# Patient Record
Sex: Female | Born: 1955 | Race: White | Hispanic: No | Smoking: Current every day smoker
Health system: Southern US, Community
[De-identification: ages and names within clinical notes are randomized; demographics above are authoritative.]

## PROBLEM LIST (undated history)

## (undated) DIAGNOSIS — F1910 Other psychoactive substance abuse, uncomplicated: Secondary | ICD-10-CM

## (undated) DIAGNOSIS — F329 Major depressive disorder, single episode, unspecified: Secondary | ICD-10-CM

## (undated) DIAGNOSIS — C801 Malignant (primary) neoplasm, unspecified: Secondary | ICD-10-CM

## (undated) DIAGNOSIS — D381 Neoplasm of uncertain behavior of trachea, bronchus and lung: Secondary | ICD-10-CM

## (undated) DIAGNOSIS — T1491XA Suicide attempt, initial encounter: Secondary | ICD-10-CM

## (undated) DIAGNOSIS — M1990 Unspecified osteoarthritis, unspecified site: Secondary | ICD-10-CM

## (undated) DIAGNOSIS — F32A Depression, unspecified: Secondary | ICD-10-CM

## (undated) HISTORY — DX: Neoplasm of uncertain behavior of trachea, bronchus and lung: D38.1

## (undated) HISTORY — PX: BREAST SURGERY: SHX581

## (undated) HISTORY — DX: Other psychoactive substance abuse, uncomplicated: F19.10

## (undated) HISTORY — PX: OTHER SURGICAL HISTORY: SHX169

## (undated) HISTORY — PX: FOREARM SURGERY: SHX651

## (undated) HISTORY — DX: Unspecified osteoarthritis, unspecified site: M19.90

---

## 2000-02-17 ENCOUNTER — Encounter: Admission: RE | Admit: 2000-02-17 | Discharge: 2000-05-17 | Payer: Self-pay | Admitting: Radiation Oncology

## 2002-04-18 ENCOUNTER — Ambulatory Visit (HOSPITAL_BASED_OUTPATIENT_CLINIC_OR_DEPARTMENT_OTHER): Admission: RE | Admit: 2002-04-18 | Discharge: 2002-04-18 | Payer: Self-pay | Admitting: *Deleted

## 2009-06-06 ENCOUNTER — Ambulatory Visit (HOSPITAL_COMMUNITY): Admission: RE | Admit: 2009-06-06 | Discharge: 2009-06-06 | Payer: Self-pay | Admitting: Family Medicine

## 2010-12-04 NOTE — Op Note (Signed)
Diana, Hernandez                             ACCOUNT NO.:  [id number]   MEDICAL RECORD NO.:  [id number]                   PATIENT TYPE:  AMB   LOCATION:  DSC                                  FACILITY:  [facility]   PHYSICIAN:  Lowell Bouton, M.D.      DATE OF BIRTH:  [birth year]   DATE OF PROCEDURE:  04/18/2002  DATE OF DISCHARGE:                                 OPERATIVE REPORT   PREOPERATIVE DIAGNOSIS:  Nonunion, left index metacarpal fracture.   POSTOPERATIVE DIAGNOSIS:  Nonunion, left index metacarpal fracture.   PROCEDURE:  Open reduction and internal fixation of left index metacarpal  fracture with bone grafting from left distal radius.   SURGEON:  Lowell Bouton, M.D.   ANESTHESIA:  General.   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  The bone was quite dense surrounding the  fracture site.   PROCEDURE:  Under general anesthesia, with the tourniquet on the left arm,  the left arm was prepped and draped in the usual fashion.  After  exsanguinating the limb, the tourniquet was inflated to 250 mmHg.   A longitudinal incision was made over the dorsum of the left index  metacarpal and blunt dissection was carried through the subcutaneous  tissues.  Bleeding points were coagulated.  Sharp dissection was carried  down through the periosteum after retracting the extensor tendon ulnarly.  Care was taken to protect the sensory nerves.  The periosteum was elevated  with a Therapist, nutritional at the fracture site.  A rongeur was used to take down  the fracture site.  The ends of the bone were then squared off with a saw,  carefully irrigating to avoid burning the bone.  After squaring off the ends  of the nonunion site, the metacarpal was pinned temporarily with an oblique  0.045 K-wire.  This was done with the index finger flexed to avoid a  rotational deformity.  After fixating with a K-wire, a  five-hole plate from  the minifragment segment was used and was contoured to the bone.  The distal  and proximal screws were inserted first using 2.7-mm screws.  The K-wire was  then removed and compression was applied with the remaining two screws  across the fracture site.  This allowed for good alignment of the fracture  and x-rays showed good position.   It was felt that bone grafting would be beneficial and so a transverse  incision was made separately over the dorsum of the distal radius.  Blunt  dissection was carried through the subcutaneous tissues and bleeding points  were coagulated.  Blunt dissection was carried down to the second dorsal  compartment and the wrist extensor tendons were retracted radially and  ulnarly.  The periosteum was incised sharply and a 0.045 K-wire was used to  drill holes in the dorsal cortex of the distal radius.  An oval-shaped  window was removed with an osteotomy and a curet was used to remove  cancellous bone.  The wound was then irrigated with saline and closed with 3-  0 subcuticular Prolene.  Steri-Strips were applied.  The bone graft was then  applied to the nonunion site and the periosteum was  closed with 4-0 Vicryl sutures.  The skin was closed with a 3-0 subcuticular  Prolene.  Steri-Strips were applied followed by sterile dressings.  The  patient was placed in a volar protective splint.  The tourniquet was  released with good circulation to the hand.  She went to the recovery room  awake, stable and in good condition.                                               Lowell Bouton, M.D.    EMM/MEDQ  D:  04/18/2002  T:  04/19/2002  Job:  [id number]

## 2013-02-28 DIAGNOSIS — R0989 Other specified symptoms and signs involving the circulatory and respiratory systems: Secondary | ICD-10-CM

## 2013-02-28 DIAGNOSIS — R4182 Altered mental status, unspecified: Secondary | ICD-10-CM

## 2016-05-07 ENCOUNTER — Encounter (HOSPITAL_COMMUNITY): Payer: Self-pay

## 2016-05-07 ENCOUNTER — Emergency Department (HOSPITAL_COMMUNITY): Payer: Managed Care, Other (non HMO)

## 2016-05-07 ENCOUNTER — Emergency Department (HOSPITAL_COMMUNITY)
Admission: EM | Admit: 2016-05-07 | Discharge: 2016-05-07 | Disposition: A | Discharge status: Home / Self Care | Payer: Managed Care, Other (non HMO) | Attending: Emergency Medicine | Admitting: Emergency Medicine

## 2016-05-07 DIAGNOSIS — F17200 Nicotine dependence, unspecified, uncomplicated: Secondary | ICD-10-CM | POA: Insufficient documentation

## 2016-05-07 DIAGNOSIS — R0781 Pleurodynia: Secondary | ICD-10-CM

## 2016-05-07 DIAGNOSIS — J189 Pneumonia, unspecified organism: Secondary | ICD-10-CM | POA: Diagnosis not present

## 2016-05-07 DIAGNOSIS — E876 Hypokalemia: Secondary | ICD-10-CM | POA: Insufficient documentation

## 2016-05-07 DIAGNOSIS — J181 Lobar pneumonia, unspecified organism: Secondary | ICD-10-CM

## 2016-05-07 LAB — CBC WITH DIFFERENTIAL/PLATELET
Basophils Absolute: 0 10*3/uL (ref 0.0–0.1)
Basophils Relative: 0 %
EOS ABS: 0.1 10*3/uL (ref 0.0–0.7)
EOS PCT: 1 %
HCT: 36.8 % (ref 36.0–46.0)
Hemoglobin: 12.3 g/dL (ref 12.0–15.0)
LYMPHS ABS: 3 10*3/uL (ref 0.7–4.0)
LYMPHS PCT: 16 %
MCH: 28.9 pg (ref 26.0–34.0)
MCHC: 33.4 g/dL (ref 30.0–36.0)
MCV: 86.6 fL (ref 78.0–100.0)
MONO ABS: 1.3 10*3/uL — AB (ref 0.1–1.0)
Monocytes Relative: 7 %
Neutro Abs: 13.9 10*3/uL — ABNORMAL HIGH (ref 1.7–7.7)
Neutrophils Relative %: 76 %
PLATELETS: 320 10*3/uL (ref 150–400)
RBC: 4.25 MIL/uL (ref 3.87–5.11)
RDW: 13.2 % (ref 11.5–15.5)
WBC: 18.2 10*3/uL — AB (ref 4.0–10.5)

## 2016-05-07 LAB — BASIC METABOLIC PANEL
Anion gap: 10 (ref 5–15)
BUN: 9 mg/dL (ref 6–20)
CO2: 25 mmol/L (ref 22–32)
Calcium: 8.7 mg/dL — ABNORMAL LOW (ref 8.9–10.3)
Chloride: 102 mmol/L (ref 101–111)
Creatinine, Ser: 0.61 mg/dL (ref 0.44–1.00)
GFR calc Af Amer: >60 mL/min (ref >60)
GFR calc non Af Amer: >60 mL/min (ref >60)
Glucose, Bld: 118 mg/dL — ABNORMAL HIGH (ref 65–99)
Potassium: 3.1 mmol/L — ABNORMAL LOW (ref 3.5–5.1)
Sodium: 137 mmol/L (ref 135–145)

## 2016-05-07 LAB — LACTIC ACID, PLASMA: LACTIC ACID, VENOUS: 1 mmol/L (ref 0.5–1.9)

## 2016-05-07 MED ORDER — ALBUTEROL SULFATE (2.5 MG/3ML) 0.083% IN NEBU: 5.0000 mg | INHALATION_SOLUTION | Freq: Once | RESPIRATORY_TRACT | Status: DC

## 2016-05-07 MED ORDER — IPRATROPIUM-ALBUTEROL 0.5-2.5 (3) MG/3ML IN SOLN
3.0000 mL | RESPIRATORY_TRACT | Status: AC
  Administered 2016-05-07: 3 mL via RESPIRATORY_TRACT
  Filled 2016-05-07: qty 3

## 2016-05-07 MED ORDER — ALBUTEROL SULFATE (2.5 MG/3ML) 0.083% IN NEBU
2.5000 mg | INHALATION_SOLUTION | RESPIRATORY_TRACT | Status: AC
  Administered 2016-05-07: 2.5 mg via RESPIRATORY_TRACT
  Filled 2016-05-07: qty 3

## 2016-05-07 MED ORDER — POTASSIUM CHLORIDE CRYS ER 20 MEQ PO TBCR
40.0000 meq | EXTENDED_RELEASE_TABLET | Freq: Once | ORAL | Status: AC
  Administered 2016-05-07: 40 meq via ORAL
  Filled 2016-05-07: qty 2

## 2016-05-07 MED ORDER — POTASSIUM CHLORIDE ER 10 MEQ PO TBCR: 10.0000 meq | EXTENDED_RELEASE_TABLET | Freq: Two times a day (BID) | ORAL | 0 refills | Status: DC

## 2016-05-07 MED ORDER — SODIUM CHLORIDE 0.9 % IV BOLUS (SEPSIS)
1000.0000 mL | Freq: Once | INTRAVENOUS | Status: AC
  Administered 2016-05-07: 1000 mL via INTRAVENOUS

## 2016-05-07 MED ORDER — AZITHROMYCIN 250 MG PO TABS: ORAL_TABLET | ORAL | 0 refills | Status: DC

## 2016-05-07 MED ORDER — NAPROXEN 250 MG PO TABS: ORAL_TABLET | ORAL | 0 refills | Status: DC

## 2016-05-07 MED ORDER — DEXTROSE 5 % IV SOLN
500.0000 mg | Freq: Once | INTRAVENOUS | Status: AC
  Administered 2016-05-07: 500 mg via INTRAVENOUS
  Filled 2016-05-07: qty 500

## 2016-05-07 MED ORDER — KETOROLAC TROMETHAMINE 30 MG/ML IJ SOLN
30.0000 mg | Freq: Once | INTRAMUSCULAR | Status: AC
  Administered 2016-05-07: 30 mg via INTRAVENOUS
  Filled 2016-05-07: qty 1

## 2016-05-07 MED ORDER — CEFTRIAXONE SODIUM 1 G IJ SOLR
1.0000 g | Freq: Once | INTRAMUSCULAR | Status: AC
  Administered 2016-05-07: 1 g via INTRAVENOUS
  Filled 2016-05-07: qty 10

## 2016-05-07 MED ORDER — IPRATROPIUM BROMIDE 0.02 % IN SOLN: 0.5000 mg | Freq: Once | RESPIRATORY_TRACT | Status: DC

## 2016-05-07 NOTE — Discharge Instructions (Signed)
Use ice and heat over the painful areas. Take mucinex DM for cough. Take the antibiotic (azithromycin) until gone, start the pills tomorrow, you got the first dose today in the ED. Take the naproxen for pain and it should also help with your fever. If you get a fever you can take acetaminophen 650 mg 4 times a day. Have your doctor recheck you in 3 weeks if you are improving to make sure the pneumonia is clearing up on your chest xray. However, if you get worse, such as struggling to breathe, uncontrolled vomiting, worsening pain or uncontrolled fevers, you should return to the ED.

## 2016-05-07 NOTE — ED Provider Notes (Signed)
[facility] DEPT Provider Note   CSN: [id number] Arrival date & time: 05/07/16  0301  Time seen 03:33 AM   History   Chief Complaint Chief Complaint  Patient presents with  . Chest Pain    right ribcage    HPI Diana Hernandez is a 59 y.o. female.  HPI patient reports she started getting URI symptoms last week with cough and had to miss several days to work however she did get back to work on the 16th. She states she was feeling better however her cough started getting worse today. She also states her cough is worse at night. She states about 2 AM she woke up with some right sided chest pain that was sharp and would come and go. She states initially the pain was about 15 minutes. Moving around and taking big deep breath makes the pain worse. She points to her right anterior lower rib cage as to where the pain is located. She denies fever although she states tonight when she came home from work she was having chills and she was diaphoretic. She denies any wheezing or sputum production. She states she does feel short of breath tonight. She also states tonight she was having dry heaves with nausea but no vomiting. She denies sore throat or diarrhea.   PCP Dr Scotty Court  History reviewed. No pertinent past medical history.  There are no active problems to display for this patient.   History reviewed. No pertinent surgical history.  OB History    No data available       Home Medications    Prior to Admission medications   Medication Sig Start Date End Date Taking? Authorizing Provider  azithromycin (ZITHROMAX) 250 MG tablet Take 1 po Daily starting on Oct 21 05/07/16   Rolland Porter, MD  naproxen (NAPROSYN) 250 MG tablet Take 1 po BID with food prn pain 05/07/16   Rolland Porter, MD  potassium chloride (K-DUR) 10 MEQ tablet Take 1 tablet (10 mEq total) by mouth 2 (two) times daily. 05/07/16   Rolland Porter, MD    Family History No family history on file.  Social History Social History    Substance Use Topics  . Smoking status: Current Every Day Smoker  . Smokeless tobacco: Never Used  . Alcohol use Yes  Lives at home Smokes one half pack per day Lives with spouse employed   Allergies   Review of patient's allergies indicates no known allergies.   Review of Systems Review of Systems  All other systems reviewed and are negative.    Physical Exam Updated Vital Signs BP 149/85   Pulse 110   Temp 98.3 F (36.8 C)   Resp 20   Ht 5\' 3"  (1.6 m)   Wt 115 lb (52.2 kg)   SpO2 95%   BMI 20.37 kg/m   Vital signs normal except tachycardia   Physical Exam  Constitutional: She is oriented to person, place, and time. She appears well-developed and well-nourished.  Non-toxic appearance. She does not appear ill. No distress.  HENT:  Head: Normocephalic and atraumatic.  Right Ear: External ear normal.  Left Ear: External ear normal.  Nose: Nose normal. No mucosal edema or rhinorrhea.  Mouth/Throat: Mucous membranes are normal. No dental abscesses or uvula swelling.  Tongue is dry  Eyes: Conjunctivae and EOM are normal. Pupils are equal, round, and reactive to light.  Neck: Normal range of motion and full passive range of motion without pain. Neck supple.  Cardiovascular: Normal rate,  regular rhythm and normal heart sounds.  Exam reveals no gallop and no friction rub.   No murmur heard. Pulmonary/Chest: Effort normal and breath sounds normal. No respiratory distress. She has no wheezes. She has no rhonchi. She has no rales. She exhibits no crepitus.    Although her lungs are clear she has a very tight sounding cough. She is noted to have episodes of coughing paroxysms. She has diffuse tenderness over right anterior lower chest without crepitance.  Abdominal: Soft. Normal appearance and bowel sounds are normal. She exhibits no distension. There is no tenderness. There is no rebound and no guarding.  Musculoskeletal: Normal range of motion. She exhibits no edema or  tenderness.  Moves all extremities well.   Neurological: She is alert and oriented to person, place, and time. She has normal strength. No cranial nerve deficit.  Skin: Skin is warm, dry and intact. No rash noted. No erythema. No pallor.  Psychiatric: She has a normal mood and affect. Her speech is normal and behavior is normal. Her mood appears not anxious.  Nursing note and vitals reviewed.    ED Treatments / Results  Labs (all labs ordered are listed, but only abnormal results are displayed) Results for orders placed or performed during the hospital encounter of 05/07/16  Culture, blood (routine x 2)  Result Value Ref Range   Specimen Description BLOOD RIGHT ANTECUBITAL    Special Requests      BOTTLES DRAWN AEROBIC AND ANAEROBIC AEB 10CC ANA 4CC   Culture PENDING    Report Status PENDING   Culture, blood (routine x 2)  Result Value Ref Range   Specimen Description BLOOD LEFT HAND    Special Requests BOTTLES DRAWN AEROBIC AND ANAEROBIC 5CC EACH    Culture PENDING    Report Status PENDING   Basic metabolic panel  Result Value Ref Range   Sodium 137 135 - 145 mmol/L   Potassium 3.1 (L) 3.5 - 5.1 mmol/L   Chloride 102 101 - 111 mmol/L   CO2 25 22 - 32 mmol/L   Glucose, Bld 118 (H) 65 - 99 mg/dL   BUN 9 6 - 20 mg/dL   Creatinine, Ser 0.61 0.44 - 1.00 mg/dL   Calcium 8.7 (L) 8.9 - 10.3 mg/dL   GFR calc non Af Amer >60 >60 mL/min   GFR calc Af Amer >60 >60 mL/min   Anion gap 10 5 - 15  CBC with Differential  Result Value Ref Range   WBC 18.2 (H) 4.0 - 10.5 K/uL   RBC 4.25 3.87 - 5.11 MIL/uL   Hemoglobin 12.3 12.0 - 15.0 g/dL   HCT 36.8 36.0 - 46.0 %   MCV 86.6 78.0 - 100.0 fL   MCH 28.9 26.0 - 34.0 pg   MCHC 33.4 30.0 - 36.0 g/dL   RDW 13.2 11.5 - 15.5 %   Platelets 320 150 - 400 K/uL   Neutrophils Relative % 76 %   Neutro Abs 13.9 (H) 1.7 - 7.7 K/uL   Lymphocytes Relative 16 %   Lymphs Abs 3.0 0.7 - 4.0 K/uL   Monocytes Relative 7 %   Monocytes Absolute 1.3 (H)  0.1 - 1.0 K/uL   Eosinophils Relative 1 %   Eosinophils Absolute 0.1 0.0 - 0.7 K/uL   Basophils Relative 0 %   Basophils Absolute 0.0 0.0 - 0.1 K/uL  Lactic acid, plasma  Result Value Ref Range   Lactic Acid, Venous 1.0 0.5 - 1.9 mmol/L   Laboratory interpretation all  normal except leukocytosis, hypokalemia    EKG  EKG Interpretation None       Radiology Dg Ribs Unilateral W/chest Right  Result Date: 05/07/2016 CLINICAL DATA:  60 year old female with cough and congestion and right-sided rib pain. EXAM: RIGHT RIBS AND CHEST - 3+ VIEW COMPARISON:  None. FINDINGS: There is area of increased density at the right lung base which may represent atelectasis/ scarring versus infiltrate. Left lung base interstitial crowding and atelectatic changes. There is mild diffuse interstitial coarsening. There is no pleural effusion or pneumothorax. The cardiac silhouette is within normal limits. There is osteopenia.  No acute fracture. IMPRESSION: No acute rib fracture or pneumothorax. Right lung base atelectasis/scarring versus infiltrate. Clinical correlation and follow-up recommended. Electronically Signed   By: Anner Crete M.D.   On: 05/07/2016 05:31    Procedures Procedures (including critical care time)  Medications Ordered in ED Medications  sodium chloride 0.9 % bolus 1,000 mL (0 mLs Intravenous Stopped 05/07/16 0557)  ipratropium-albuterol (DUONEB) 0.5-2.5 (3) MG/3ML nebulizer solution 3 mL (3 mLs Nebulization Given 05/07/16 0359)  albuterol (PROVENTIL) (2.5 MG/3ML) 0.083% nebulizer solution 2.5 mg (2.5 mg Nebulization Given 05/07/16 0359)  potassium chloride SA (K-DUR,KLOR-CON) CR tablet 40 mEq (40 mEq Oral Given 05/07/16 0456)  cefTRIAXone (ROCEPHIN) 1 g in dextrose 5 % 50 mL IVPB (0 g Intravenous Stopped 05/07/16 0529)  azithromycin (ZITHROMAX) 500 mg in dextrose 5 % 250 mL IVPB (0 mg Intravenous Stopped 05/07/16 0557)  ketorolac (TORADOL) 30 MG/ML injection 30 mg (30 mg  Intravenous Given 05/07/16 0557)     Initial Impression / Assessment and Plan / ED Course  I have reviewed the triage vital signs and the nursing notes.  Pertinent labs & imaging results that were available during my care of the patient were reviewed by me and considered in my medical decision making (see chart for details).  Clinical Course   Because of her tight sounding cough she was given a albuterol nebulizer to see if that will help with her feeling of shortness of breath or coughing. She was given a liter of normal saline because she did appear to be mildly dehydrated.  Recheck at 4:35 AM patient reports nebulizer did not change how she felt. I had already looked at her chest x-ray and she has a right lower lobe infiltrate. She was started on 2 ED acquired pneumonia antibiotics. Blood cultures and lactic acid was done. I'm going to have nursing staff emulate patient with pulse ox and make sure she is not hypoxic. We are still waiting for radiology for the official reading of her rib x-rays.  Pt was given oral potassium for his hypokalemia  5 AM patient was ambulated by nursing staff and her pulse ox remained 93% or higher. Still waiting for her x-ray to be read by radiology.  Recheck at 06:00 AM radiology has read her CXR. Pt states she is feeling better and feels she can be discharged home. Does not have to work until the 23rd. Discussed her discharge instructions.   Final Clinical Impressions(s) / ED Diagnoses   Final diagnoses:  Hypokalemia  Pleuritic chest pain  Community acquired pneumonia of right lower lobe of lung (HCC)    New Prescriptions New Prescriptions   AZITHROMYCIN (ZITHROMAX) 250 MG TABLET    Take 1 po Daily starting on Oct 21   NAPROXEN (NAPROSYN) 250 MG TABLET    Take 1 po BID with food prn pain   POTASSIUM CHLORIDE (K-DUR) 10 MEQ TABLET  Take 1 tablet (10 mEq total) by mouth 2 (two) times daily.    Plan discharge  Rolland Porter, MD, Barbette Or, MD 05/07/16 [phone]

## 2016-05-07 NOTE — ED Notes (Signed)
Pt walked without assistance and oxygen saturation did not drop lower than 93% on room air.

## 2016-05-07 NOTE — ED Triage Notes (Signed)
Pt states she has had cold symptoms recently and since last night has been coughing. Pt states she awoke with pain to her right ribcage that is worse with cough and palpation.

## 2016-05-12 LAB — CULTURE, BLOOD (ROUTINE X 2)
CULTURE: NO GROWTH
Culture: NO GROWTH

## 2017-12-28 ENCOUNTER — Other Ambulatory Visit: Payer: Self-pay

## 2017-12-28 ENCOUNTER — Inpatient Hospital Stay (HOSPITAL_COMMUNITY)
Admission: AD | Admit: 2017-12-28 | Discharge: 2018-01-02 | DRG: 885 | Disposition: A | Discharge status: Home / Self Care | Payer: 59 | Source: Intra-hospital | Attending: Psychiatry | Admitting: Psychiatry

## 2017-12-28 ENCOUNTER — Encounter (HOSPITAL_COMMUNITY): Payer: Self-pay | Admitting: *Deleted

## 2017-12-28 ENCOUNTER — Encounter (HOSPITAL_COMMUNITY): Payer: Self-pay

## 2017-12-28 ENCOUNTER — Emergency Department (HOSPITAL_COMMUNITY)
Admission: EM | Admit: 2017-12-28 | Discharge: 2017-12-28 | Disposition: A | Discharge status: Other Acute Inpatient Hospital | Payer: Managed Care, Other (non HMO) | Attending: Emergency Medicine | Admitting: Emergency Medicine

## 2017-12-28 DIAGNOSIS — T1491XA Suicide attempt, initial encounter: Secondary | ICD-10-CM | POA: Insufficient documentation

## 2017-12-28 DIAGNOSIS — F419 Anxiety disorder, unspecified: Secondary | ICD-10-CM | POA: Diagnosis present

## 2017-12-28 DIAGNOSIS — Y929 Unspecified place or not applicable: Secondary | ICD-10-CM | POA: Insufficient documentation

## 2017-12-28 DIAGNOSIS — G4700 Insomnia, unspecified: Secondary | ICD-10-CM | POA: Diagnosis present

## 2017-12-28 DIAGNOSIS — R45851 Suicidal ideations: Secondary | ICD-10-CM | POA: Insufficient documentation

## 2017-12-28 DIAGNOSIS — N390 Urinary tract infection, site not specified: Secondary | ICD-10-CM | POA: Diagnosis present

## 2017-12-28 DIAGNOSIS — F17200 Nicotine dependence, unspecified, uncomplicated: Secondary | ICD-10-CM | POA: Insufficient documentation

## 2017-12-28 DIAGNOSIS — Z859 Personal history of malignant neoplasm, unspecified: Secondary | ICD-10-CM | POA: Diagnosis not present

## 2017-12-28 DIAGNOSIS — T50992A Poisoning by other drugs, medicaments and biological substances, intentional self-harm, initial encounter: Secondary | ICD-10-CM | POA: Insufficient documentation

## 2017-12-28 DIAGNOSIS — Z046 Encounter for general psychiatric examination, requested by authority: Secondary | ICD-10-CM | POA: Insufficient documentation

## 2017-12-28 DIAGNOSIS — F329 Major depressive disorder, single episode, unspecified: Secondary | ICD-10-CM | POA: Diagnosis present

## 2017-12-28 DIAGNOSIS — K5900 Constipation, unspecified: Secondary | ICD-10-CM | POA: Diagnosis present

## 2017-12-28 DIAGNOSIS — F332 Major depressive disorder, recurrent severe without psychotic features: Secondary | ICD-10-CM | POA: Diagnosis present

## 2017-12-28 DIAGNOSIS — Y9389 Activity, other specified: Secondary | ICD-10-CM | POA: Insufficient documentation

## 2017-12-28 DIAGNOSIS — Z79899 Other long term (current) drug therapy: Secondary | ICD-10-CM | POA: Diagnosis not present

## 2017-12-28 DIAGNOSIS — Z23 Encounter for immunization: Secondary | ICD-10-CM

## 2017-12-28 DIAGNOSIS — F17210 Nicotine dependence, cigarettes, uncomplicated: Secondary | ICD-10-CM | POA: Diagnosis present

## 2017-12-28 DIAGNOSIS — X838XXA Intentional self-harm by other specified means, initial encounter: Secondary | ICD-10-CM | POA: Insufficient documentation

## 2017-12-28 DIAGNOSIS — Y999 Unspecified external cause status: Secondary | ICD-10-CM | POA: Diagnosis not present

## 2017-12-28 DIAGNOSIS — R300 Dysuria: Secondary | ICD-10-CM

## 2017-12-28 DIAGNOSIS — Z915 Personal history of self-harm: Secondary | ICD-10-CM

## 2017-12-28 DIAGNOSIS — T450X2A Poisoning by antiallergic and antiemetic drugs, intentional self-harm, initial encounter: Secondary | ICD-10-CM | POA: Diagnosis not present

## 2017-12-28 DIAGNOSIS — Z7289 Other problems related to lifestyle: Secondary | ICD-10-CM | POA: Diagnosis not present

## 2017-12-28 HISTORY — DX: Depression, unspecified: F32.A

## 2017-12-28 HISTORY — DX: Malignant (primary) neoplasm, unspecified: C80.1

## 2017-12-28 HISTORY — DX: Suicide attempt, initial encounter: T14.91XA

## 2017-12-28 HISTORY — DX: Major depressive disorder, single episode, unspecified: F32.9

## 2017-12-28 LAB — COMPREHENSIVE METABOLIC PANEL
ALBUMIN: 3.9 g/dL (ref 3.5–5.0)
ALT: 16 U/L (ref 14–54)
ANION GAP: 9 (ref 5–15)
AST: 16 U/L (ref 15–41)
Alkaline Phosphatase: 96 U/L (ref 38–126)
BUN: 16 mg/dL (ref 6–20)
CHLORIDE: 102 mmol/L (ref 101–111)
CO2: 29 mmol/L (ref 22–32)
CREATININE: 0.68 mg/dL (ref 0.44–1.00)
Calcium: 8.9 mg/dL (ref 8.9–10.3)
GFR calc non Af Amer: >60 mL/min (ref >60)
Glucose, Bld: 104 mg/dL — ABNORMAL HIGH (ref 65–99)
Potassium: 3.5 mmol/L (ref 3.5–5.1)
SODIUM: 140 mmol/L (ref 135–145)
Total Bilirubin: 0.7 mg/dL (ref 0.3–1.2)
Total Protein: 6.9 g/dL (ref 6.5–8.1)

## 2017-12-28 LAB — CBC WITH DIFFERENTIAL/PLATELET
BASOS PCT: 1 %
Basophils Absolute: 0.1 10*3/uL (ref 0.0–0.1)
EOS ABS: 0.2 10*3/uL (ref 0.0–0.7)
EOS PCT: 2 %
HCT: 41.4 % (ref 36.0–46.0)
Hemoglobin: 13.1 g/dL (ref 12.0–15.0)
Lymphocytes Relative: 34 %
Lymphs Abs: 3.1 10*3/uL (ref 0.7–4.0)
MCH: 28.4 pg (ref 26.0–34.0)
MCHC: 31.6 g/dL (ref 30.0–36.0)
MCV: 89.6 fL (ref 78.0–100.0)
MONOS PCT: 8 %
Monocytes Absolute: 0.8 10*3/uL (ref 0.1–1.0)
Neutro Abs: 5.2 10*3/uL (ref 1.7–7.7)
Neutrophils Relative %: 55 %
Platelets: 360 10*3/uL (ref 150–400)
RBC: 4.62 MIL/uL (ref 3.87–5.11)
RDW: 13.3 % (ref 11.5–15.5)
WBC: 9.3 10*3/uL (ref 4.0–10.5)

## 2017-12-28 LAB — ETHANOL: Alcohol, Ethyl (B): <10 mg/dL (ref <10)

## 2017-12-28 LAB — URINALYSIS, ROUTINE W REFLEX MICROSCOPIC
BILIRUBIN URINE: NEGATIVE
Bacteria, UA: NONE SEEN
Glucose, UA: NEGATIVE mg/dL
Hgb urine dipstick: NEGATIVE
Ketones, ur: NEGATIVE mg/dL
Nitrite: NEGATIVE
Protein, ur: NEGATIVE mg/dL
SPECIFIC GRAVITY, URINE: 1.012 (ref 1.005–1.030)
pH: 7 (ref 5.0–8.0)

## 2017-12-28 LAB — RAPID URINE DRUG SCREEN, HOSP PERFORMED
AMPHETAMINES: NOT DETECTED
Barbiturates: NOT DETECTED
Benzodiazepines: NOT DETECTED
COCAINE: NOT DETECTED
OPIATES: NOT DETECTED
TETRAHYDROCANNABINOL: NOT DETECTED

## 2017-12-28 LAB — SALICYLATE LEVEL

## 2017-12-28 LAB — ACETAMINOPHEN LEVEL

## 2017-12-28 MED ORDER — PNEUMOCOCCAL VAC POLYVALENT 25 MCG/0.5ML IJ INJ
0.5000 mL | INJECTION | INTRAMUSCULAR | Status: AC
  Administered 2017-12-29: 0.5 mL via INTRAMUSCULAR

## 2017-12-28 MED ORDER — NITROFURANTOIN MONOHYD MACRO 100 MG PO CAPS
100.0000 mg | ORAL_CAPSULE | Freq: Two times a day (BID) | ORAL | Status: AC
  Administered 2017-12-28 – 2018-01-01 (×9): 100 mg via ORAL
  Filled 2017-12-28 (×11): qty 1

## 2017-12-28 MED ORDER — NITROFURANTOIN MONOHYD MACRO 100 MG PO CAPS
100.0000 mg | ORAL_CAPSULE | Freq: Two times a day (BID) | ORAL | Status: DC
  Administered 2017-12-28: 100 mg via ORAL
  Filled 2017-12-28: qty 1

## 2017-12-28 MED ORDER — NICOTINE POLACRILEX 2 MG MT GUM
2.0000 mg | CHEWING_GUM | OROMUCOSAL | Status: DC | PRN
  Administered 2017-12-30 – 2018-01-02 (×6): 2 mg via ORAL
  Filled 2017-12-28 (×2): qty 1

## 2017-12-28 NOTE — ED Notes (Signed)
TTS in progress 

## 2017-12-28 NOTE — ED Notes (Signed)
Patient wanded and changed into purple scrubs. Patient wanded a second time after changing. Pt and pt's mother informed of visiting hours and suicide precaution policies. Patient wrote down list of her important contacts. Patients belongings (clothes, pocketbook, cell phone, shoes) all given to patient's mother who states she is taking everything back home. Patient calm and cooperative at this time.

## 2017-12-28 NOTE — ED Notes (Signed)
Poison control notified of pt's overdose.  Was instructed to obtain tylenol level, aspirin level, and LFT's.  Results given to poison control and pt cleared by poison control.  Notified Dr. Melina Copa.

## 2017-12-28 NOTE — Progress Notes (Signed)
Diana Hernandez is a 62 year old female pt admitted on voluntary basis. She reports that she took the overdose of medications and got in her car and drove to an area with the intention of going to sleep and not waking up. She reports that the daughter of her friend found her and she wound up coming into the hospital. She does not report any stressors other than she has not been resting well and being stressed. She denies any substance abuse issues and UDS and ETOH were negative. She reports that she takes her depression medication as prescribed and reports that she has been taking it for years. She reports that she lives with her ex-husband, Liliane Channel, and reports the relationship is doing good and she will go back there after discharge. Khalise was oriented to the unit and safety maintained.

## 2017-12-28 NOTE — ED Provider Notes (Signed)
[facility] EMERGENCY DEPARTMENT Provider Note   CSN: [id number] Arrival date & time: 12/28/17  1012     History   Chief Complaint Chief Complaint  Patient presents with  . V70.1    HPI Diana Hernandez is a 62 y.o. female.  She has a prior history of depression and suicide attempt.  She is here today after being referred from the primary care office.  She has been feeling more depressed and down over the past few weeks.  She is gotten some unfortunate family news about her relative not coming to visit and she was despondent about this.  On Monday she took 96 tablets of a sleep aid from [facility] in an attempt to harm herself around 2 PM.  Family notified the police and they were attempting to look for her and eventually found her Monday evening.  Today she saw her primary care doctor and they referred her here for evaluation.  During the time that she is missing on Monday she did text family to say that she loved them.  Currently she denies any medical complaints she denies suicidal ideation here.  Patient also states she has had some urinary symptoms over the past few weeks and when she went to the PCP today they diagnosed with UTI and gave her a prescription for Macrobid.  She has not started any medication yet for this     The history is provided by the patient and a relative.  Mental Health Problem  Presenting symptoms: depression, suicidal thoughts and suicide attempt   Patient accompanied by:  Family member Degree of incapacity (severity):  Severe Onset quality:  Gradual Timing:  Constant Progression:  Worsening Chronicity:  Recurrent Context: alcohol use (social saturdays) and stressful life event   Context: not drug abuse   Treatment compliance:  Some of the time Ineffective treatments:  None tried Associated symptoms: feelings of worthlessness and poor judgment   Associated symptoms: no abdominal pain, no appetite change, no chest pain and no headaches     Past Medical  History:  Diagnosis Date  . Cancer ([place])   . Depression   . Suicide attempt ([place])     There are no active problems to display for this patient.   Past Surgical History:  Procedure Laterality Date  . BREAST SURGERY       OB History   None      Home Medications    Prior to Admission medications   Medication Sig Start Date End Date Taking? Authorizing Provider  azithromycin (ZITHROMAX) 250 MG tablet Take 1 po Daily starting on Oct 21 05/07/16   Rolland Porter, MD  naproxen (NAPROSYN) 250 MG tablet Take 1 po BID with food prn pain 05/07/16   Rolland Porter, MD  potassium chloride (K-DUR) 10 MEQ tablet Take 1 tablet (10 mEq total) by mouth 2 (two) times daily. 05/07/16   Rolland Porter, MD    Family History No family history on file.  Social History Social History   Tobacco Use  . Smoking status: Current Every Day Smoker  . Smokeless tobacco: Never Used  Substance Use Topics  . Alcohol use: Yes    Comment: occ  . Drug use: No     Allergies   Sulfa antibiotics   Review of Systems Review of Systems  Constitutional: Negative for appetite change and fever.  HENT: Negative for sore throat.   Eyes: Negative for visual disturbance.  Respiratory: Negative for shortness of breath.   Cardiovascular: Negative for  chest pain.  Gastrointestinal: Negative for abdominal pain.  Genitourinary: Positive for dysuria and frequency. Negative for hematuria.  Musculoskeletal: Negative for gait problem.  Skin: Negative for rash.  Neurological: Negative for seizures and headaches.  Psychiatric/Behavioral: Positive for dysphoric mood and suicidal ideas.     Physical Exam Updated Vital Signs BP (!) 142/72 (BP Location: Right Arm)   Pulse 90   Temp 98 F (36.7 C) (Oral)   Resp 18   Ht 5\' 4"  (1.626 m)   Wt 49 kg (108 lb)   SpO2 99%   BMI 18.54 kg/m   Physical Exam  Constitutional: She appears well-developed and well-nourished. No distress.  HENT:  Head: Normocephalic and  atraumatic.  Eyes: Conjunctivae are normal.  Neck: Neck supple.  Cardiovascular: Normal rate and regular rhythm.  No murmur heard. Pulmonary/Chest: Effort normal and breath sounds normal. No respiratory distress.  Abdominal: Soft. There is no tenderness.  Musculoskeletal: She exhibits no edema, tenderness or deformity.  Neurological: She is alert.  Skin: Skin is warm and dry.  Psychiatric: She has a normal mood and affect. Her speech is normal and behavior is normal. Thought content normal. Cognition and memory are normal.  Nursing note and vitals reviewed.    ED Treatments / Results  Labs (all labs ordered are listed, but only abnormal results are displayed) Labs Reviewed  COMPREHENSIVE METABOLIC PANEL - Abnormal; Notable for the following components:      Result Value   Glucose, Bld 104 (*)    All other components within normal limits  ACETAMINOPHEN LEVEL - Abnormal; Notable for the following components:   Acetaminophen (Tylenol), Serum <10 (*)    All other components within normal limits  URINALYSIS, ROUTINE W REFLEX MICROSCOPIC - Abnormal; Notable for the following components:   APPearance HAZY (*)    Leukocytes, UA MODERATE (*)    All other components within normal limits  ETHANOL  RAPID URINE DRUG SCREEN, HOSP PERFORMED  CBC WITH DIFFERENTIAL/PLATELET  SALICYLATE LEVEL    EKG EKG Interpretation  Date/Time:  Wednesday December 28 2017 11:45:34 EDT Ventricular Rate:  77 PR Interval:  148 QRS Duration: 84 QT Interval:  408 QTC Calculation: 461 R Axis:   18 Text Interpretation:  Normal sinus rhythm Possible Left atrial enlargement Septal infarct , age undetermined Abnormal ECG nl intervals no prior to compare with Confirmed by Aletta Edouard [phone]) on 12/28/2017 11:57:05 AM   Radiology No results found.  Procedures Procedures (including critical care time)  Medications Ordered in ED Medications - No data to display   Initial Impression / Assessment and Plan /  ED Course  I have reviewed the triage vital signs and the nursing notes.  Pertinent labs & imaging results that were available during my care of the patient were reviewed by me and considered in my medical decision making (see chart for details).  Clinical Course as of Dec 29 917  Wed Dec 28, 2017  1400 Behavioral health evaluated the patient and they felt she would be appropriate for inpatient.  They had requested that poison control be involved in the nurse had discussed with poison control they felt no other testing was indicated for evaluation of this patient.  Patient is medically cleared from my standpoint with no acute medical condition identified on her screening exam.   [MB]    Clinical Course User Index [MB] Hayden Rasmussen, MD     Final Clinical Impressions(s) / ED Diagnoses   Final diagnoses:  Suicide attempt by inadequate  means, initial encounter [facility])  Dysuria    ED Discharge Orders    None       Hayden Rasmussen, MD 12/29/17 [phone]

## 2017-12-28 NOTE — Progress Notes (Signed)
Pt accepted to  [facility], Bed 304-1 (Pt to program on [address]) Shuvon Rankin, NP, is the accepting provider.  Dr. Neita Garnet is the attending provider.  Call report to [phone]  [email or web address] notified.   Pt is Voluntary.  Pt may be transported by [facility]  Pt scheduled  to arrive as soon as EDP completes note medically clearing pt.  Areatha Keas. Judi Cong, MSW, [facility] Clinical Social Work [phone] (cell) [phone] (office)

## 2017-12-28 NOTE — ED Notes (Signed)
Pt given lunch tray.

## 2017-12-28 NOTE — Progress Notes (Signed)
Patient did not attend NA group meeting.

## 2017-12-28 NOTE — ED Triage Notes (Signed)
Pt sent by PCP today.  Pt reports she took a bottle of [facility] pills around 2pm Monday in her car sitting in a parking lot.  Reports a friend found her around 9:30 that night sitting in her car.  Pt went to work yesterday and mother made her go to pcp today.  Pt says she doesn't want to hurt herself now, says she is just tired.  PCP put pt on nitrofurantoin mono mcr 100mg  bid.  Pt says she doesn't know why she took all the pills.  Mother says she goes through spells of anger.  Mother says pt became very angry after hearing that her brother wasn't coming to visit in July.

## 2017-12-28 NOTE — Progress Notes (Signed)
Disposition CSW called and spoke to [place], Therapist, sports, in [place] ED, and requested that patient's nurse call poison control to report pt's reported overdose and get recommendations.  Also requested that EDP document medical clearance once recommendations are received and acted on (if necessary).  [facility] will then review for possible placement.  Disposition CSW will continue to follow for placement.  Areatha Keas. Judi Cong, MSW, [place] Disposition Clinical Social Work [phone] (cell) [phone] (office)

## 2017-12-28 NOTE — Tx Team (Signed)
Initial Treatment Plan 12/28/2017 5:12 PM Diana Hernandez    PATIENT STRESSORS: Financial difficulties Marital or family conflict   PATIENT STRENGTHS: Ability for insight Average or above average intelligence Capable of independent living General fund of knowledge Motivation for treatment/growth   PATIENT IDENTIFIED PROBLEMS: Depression Suicidal thoughts "havent' been resting well, feeling stressed"                     DISCHARGE CRITERIA:  Ability to meet basic life and health needs Improved stabilization in mood, thinking, and/or behavior Reduction of life-threatening or endangering symptoms to within safe limits Verbal commitment to aftercare and medication compliance  PRELIMINARY DISCHARGE PLAN: Attend aftercare/continuing care group Return to previous living arrangement  PATIENT/FAMILY INVOLVEMENT: This treatment plan has been presented to and reviewed with the patient, Diana Hernandez, and/or family member, .  The patient and family have been given the opportunity to ask questions and make suggestions.  Ragina Fenter, [place], [place] 12/28/2017, 5:12 PM

## 2017-12-28 NOTE — ED Notes (Signed)
EDP at bedside  

## 2017-12-28 NOTE — Progress Notes (Addendum)
Patient ID: Diana Hernandez, female   DOB: 1956/03/17, 62 y.o.   MRN: [id number]   Report accepted from admitting [place], [place]. Pt currently presents with a flat affect and depressed behavior. Reports ongoing sedation. Pt supported emotionally and encouraged to express concerns and questions. Pt's safety ensured with 15 minute and environmental checks. Pt currently denies SI/HI and A/V hallucinations. Pt verbally agrees to seek staff if SI/HI or A/VH occurs and to consult with staff before acting on any harmful thoughts. Pt requests to have dinner tray. Will continue POC.

## 2017-12-28 NOTE — ED Notes (Signed)
[facility] transport called will be here by 1530 for pick up.

## 2017-12-28 NOTE — BH Assessment (Signed)
Tele Assessment Note   Patient Name: Diana Hernandez MRN: [id number] Referring Physician: EDP Location of Patient: [facility] Location of Provider: [place] is an 62 y.o. female who presented to [place] on voluntary basis with complaint of suicidal ideation and recent suicide attempt.  Pt provided history.  Pt lives in [place] with her significant other (who is also her ex-husband) and is fully employed.  She is referral from her Primary Care Physician.  Pt reported that she has a history of depressive symptoms, and that she has attempted suicide by overdose at least once before (five years ago).  Pt reported that on Monday, she became distraught due to family conflict and intentionally ingested 66 sleeping aid tabs in a self-described suicide attempt.  Pt also sent text messages of love to family members.  Pt was eventually found by family members.  In addition to this suicide attempt, Pt endorsed despondency; insomnia; feelings of worthlessness/hopelessness/ fatigue; impulsivity.  Pt denied hallucination, homicidal ideation, self-injurious behavior, and substance use concerns.  UDS and BAC were clear.  Pt denied any past or current psychiatric care or hospitalization.  All medications are prescribed through her PCP.  During assessment, Pt presented as alert and oriented.  She ahd good eye contact and was cooperative.  Pt was dressed in scrubs, and she appeared appropriately groomed.  Pt's mood was depressed.  Affect was mood-congruent.  Pt admitted to suicidal ideation, despondency, and other depressive symptoms.  Pt's speech was normal in rate, rhythm, and volume.  Thought processes were within normal range, and thought content was logical.  There was no evidence of delusion.  Pt's memory and concentration were intact.  Insight, judgment, and impulse control were poor.  Consulted with S. Rankin, NP, who determined that Pt meets inpatient criteria.  Diagnosis: 32.2 Major  Depressive Disorder, Recurrent, Severe w/o psychotic features  Past Medical History:  Past Medical History:  Diagnosis Date  . Cancer ([place])   . Depression   . Suicide attempt [facility])     Past Surgical History:  Procedure Laterality Date  . BREAST SURGERY      Family History: No family history on file.  Social History:  reports that she has been smoking.  She has never used smokeless tobacco. She reports that she drinks alcohol. She reports that she does not use drugs.  Additional Social History:  Alcohol / Drug Use Pain Medications: See MAR Prescriptions: See MAR Over the Counter: See MAR History of alcohol / drug use?: No history of alcohol / drug abuse  CIWA: CIWA-Ar BP: (!) 142/72 Pulse Rate: 90 COWS:    Allergies:  Allergies  Allergen Reactions  . Sulfa Antibiotics     Home Medications:  (Not in a hospital admission)  OB/GYN Status:  No LMP recorded. Patient is postmenopausal.  General Assessment Data Location of Assessment: [facility] TTS Assessment: In system Is this a Tele or Face-to-Face Assessment?: Tele Assessment Is this an Initial Assessment or a Re-assessment for this encounter?: Initial Assessment Marital status: Long term relationship Is patient pregnant?: No Pregnancy Status: No Living Arrangements: Spouse/significant other Can pt return to current living arrangement?: Yes Admission Status: Voluntary Is patient capable of signing voluntary admission?: Yes Referral Source: Self/Family/Friend Insurance type: C[employer]     Crisis Care Plan Living Arrangements: Spouse/significant other Name of Psychiatrist: None Name of Therapist: None  Education Status Is patient currently in school?: No Is the patient employed, unemployed or receiving disability?: Employed  Risk  to self with the past 6 months Suicidal Ideation: Yes-Currently Present Has patient been a risk to self within the past 6 months prior to admission? : No Suicidal Intent: Yes-Currently  Present Has patient had any suicidal intent within the past 6 months prior to admission? : No Is patient at risk for suicide?: Yes Suicidal Plan?: Yes-Currently Present Has patient had any suicidal plan within the past 6 months prior to admission? : No Specify Current Suicidal Plan: Pt overdosed on 96 sleeping aid pills Access to Means: Yes Specify Access to Suicidal Means: OTC meds What has been your use of drugs/alcohol within the last 12 months?: Pt Denied Previous Attempts/Gestures: Yes How many times?: 1 Other Self Harm Risks: NA Triggers for Past Attempts: Family contact Intentional Self Injurious Behavior: None Family Suicide History: No Recent stressful life event(s): Conflict (Comment)(conflict with son) Persecutory voices/beliefs?: No Depression: Yes Depression Symptoms: Despondent, Insomnia, Isolating, Fatigue, Feeling worthless/self pity, Feeling angry/irritable Substance abuse history and/or treatment for substance abuse?: No Suicide prevention information given to non-admitted patients: Not applicable  Risk to Others within the past 6 months Homicidal Ideation: No Does patient have any lifetime risk of violence toward others beyond the six months prior to admission? : No Thoughts of Harm to Others: No Current Homicidal Intent: No Current Homicidal Plan: No Access to Homicidal Means: No History of harm to others?: No Assessment of Violence: None Noted Does patient have access to weapons?: No Criminal Charges Pending?: No Does patient have a court date: No Is patient on probation?: No  Psychosis Hallucinations: None noted Delusions: None noted  Mental Status Report Appearance/Hygiene: In scrubs, Unremarkable Eye Contact: Fair Motor Activity: Freedom of movement Speech: Unremarkable Level of Consciousness: Alert Mood: Depressed Affect: Appropriate to circumstance Anxiety Level: None Thought Processes: Coherent, Relevant Judgement: Impaired Orientation:  Person, Place, Time, Situation Obsessive Compulsive Thoughts/Behaviors: None  Cognitive Functioning Concentration: Normal Memory: Recent Intact, Remote Intact Is patient IDD: No Is patient DD?: No Insight: Poor Impulse Control: Poor Appetite: Good Have you had any weight changes? : No Change Sleep: Decreased Total Hours of Sleep: 4 Vegetative Symptoms: None  ADLScreening [facility] Assessment Services) Patient's cognitive ability adequate to safely complete daily activities?: Yes Patient able to express need for assistance with ADLs?: Yes Independently performs ADLs?: Yes (appropriate for developmental age)  Prior Inpatient Therapy Prior Inpatient Therapy: No  Prior Outpatient Therapy Prior Outpatient Therapy: No Does patient have an ACCT team?: No Does patient have Intensive In-House Services?  : No Does patient have [facility] services? : No Does patient have P4CC services?: No  ADL Screening (condition at time of admission) Patient's cognitive ability adequate to safely complete daily activities?: Yes Is the patient deaf or have difficulty hearing?: No Does the patient have difficulty concentrating, remembering, or making decisions?: No Patient able to express need for assistance with ADLs?: Yes Does the patient have difficulty dressing or bathing?: No Independently performs ADLs?: Yes (appropriate for developmental age) Does the patient have difficulty walking or climbing stairs?: No Weakness of Legs: None Weakness of Arms/Hands: None  Home Assistive Devices/Equipment Home Assistive Devices/Equipment: None  Therapy Consults (therapy consults require a physician order) PT Evaluation Needed: No OT Evalulation Needed: No SLP Evaluation Needed: No Abuse/Neglect Assessment (Assessment to be complete while patient is alone) Abuse/Neglect Assessment Can Be Completed: Yes Physical Abuse: Denies Verbal Abuse: Denies Sexual Abuse: Denies Exploitation of patient/patient's  resources: Denies Self-Neglect: Denies Values / Beliefs Cultural Requests During Hospitalization: None Spiritual Requests During Hospitalization: None  Consults Spiritual Care Consult Needed: No Social Work Consult Needed: No Regulatory affairs officer (For Healthcare) Does Patient Have a Medical Advance Directive?: No Would patient like information on creating a medical advance directive?: No - Patient declined    Additional Information 1:1 In Past 12 Months?: No CIRT Risk: No Elopement Risk: No Does patient have medical clearance?: Yes     Disposition:  Disposition Initial Assessment Completed for this Encounter: Yes Disposition of Patient: Admit Type of inpatient treatment program: Adult(Per S.Rankin, NP, Pt meets inpt criteria)  This service was provided via telemedicine using a 2-way, interactive audio and video technology.  Names of all persons participating in this telemedicine service and their role in this encounter. Name: Rudolph, Dobler Role: Patient             Marlowe Aschoff 12/28/2017 1:02 PM

## 2017-12-29 DIAGNOSIS — F332 Major depressive disorder, recurrent severe without psychotic features: Principal | ICD-10-CM

## 2017-12-29 DIAGNOSIS — F419 Anxiety disorder, unspecified: Secondary | ICD-10-CM

## 2017-12-29 DIAGNOSIS — N390 Urinary tract infection, site not specified: Secondary | ICD-10-CM

## 2017-12-29 DIAGNOSIS — G4700 Insomnia, unspecified: Secondary | ICD-10-CM

## 2017-12-29 DIAGNOSIS — F17210 Nicotine dependence, cigarettes, uncomplicated: Secondary | ICD-10-CM

## 2017-12-29 DIAGNOSIS — T450X2A Poisoning by antiallergic and antiemetic drugs, intentional self-harm, initial encounter: Secondary | ICD-10-CM

## 2017-12-29 DIAGNOSIS — T1491XA Suicide attempt, initial encounter: Secondary | ICD-10-CM

## 2017-12-29 DIAGNOSIS — Z915 Personal history of self-harm: Secondary | ICD-10-CM

## 2017-12-29 MED ORDER — TRAZODONE HCL 50 MG PO TABS: 50.0000 mg | ORAL_TABLET | Freq: Every evening | ORAL | Status: DC | PRN

## 2017-12-29 MED ORDER — VENLAFAXINE HCL ER 150 MG PO CP24
150.0000 mg | ORAL_CAPSULE | Freq: Every day | ORAL | Status: DC
  Administered 2017-12-30 – 2018-01-02 (×4): 150 mg via ORAL
  Filled 2017-12-29 (×6): qty 1

## 2017-12-29 MED ORDER — HYDROXYZINE HCL 25 MG PO TABS: 25.0000 mg | ORAL_TABLET | Freq: Four times a day (QID) | ORAL | Status: DC | PRN

## 2017-12-29 MED ORDER — ARIPIPRAZOLE 10 MG PO TABS
10.0000 mg | ORAL_TABLET | Freq: Every day | ORAL | Status: DC
  Administered 2017-12-29 – 2018-01-02 (×5): 10 mg via ORAL
  Filled 2017-12-29 (×8): qty 1

## 2017-12-29 NOTE — [facility] Suicide Risk Assessment (Signed)
[facility] Admission Suicide Risk Assessment   Nursing information obtained from:  Patient Demographic factors:  Divorced or widowed, Caucasian, Low socioeconomic status Current Mental Status:  Suicidal ideation indicated by patient, Self-harm thoughts, Self-harm behaviors Loss Factors:  Financial problems / change in socioeconomic status Historical Factors:  Prior suicide attempts, Family history of mental illness or substance abuse Risk Reduction Factors:  Living with another person, especially a relative, Positive coping skills or problem solving skills  Total Time spent with patient: 1 hour Principal Problem: MDD (major depressive disorder), recurrent episode, severe ([place]) Diagnosis:   Patient Active Problem List   Diagnosis Date Noted  . MDD (major depressive disorder), recurrent episode, severe ([place]) [F33.2] 12/28/2017   Subjective Data:   Diana Hernandez is a 62 y/o F with history of MDD who was admitted voluntarily from [facility] ED where she was referred from her primary care office after she attempted suicide on 6/10 by taking 96 tablets of an OTC sleep aid. Pt was found by her friend's daughter and not immediately taken for medical evaluation. Pt was medically cleared and then transferred to [facility] for additional treatment and evaluation.  Upon initial evaluation, pt shares, "I'm depressed, and I tried to take so many sleeping pills. Just seems like everything is on repeat." Pt goes on to describe feeling anhedonic about her lifestyle of working on most days evening shifts and then returning home to take her son to his night shift. Pt reports she sleeps poorly because she has to get up early to pick up her son from work at [employer]. She endorses additional depression symptoms of guilty feelings, low energy, and psychomotor retardation. She is unsure how long she has been thinking about suicide, but she states, "It's been in the back of my mind." She has previous attempt via overdose on xanax. She  denies current SI/HI/AH/VH. She denies symptoms of mania, OCD, and PTSD. She reports using alcohol on weekends about 2-4 drinks on those occassions, and she denies other illicit substance use.   Discussed with patient about treatment options. She reports good adherence to home medication of Effexor prescribed by her PCP. She feels it is partially effective, but has decreased efficacy lately. We discussed option of augmentation with abilify, and pt was in agreement. She had no further questions, comments or concerns.   Continued Clinical Symptoms:  Alcohol Use Disorder Identification Test Final Score (AUDIT): 3 The "Alcohol Use Disorders Identification Test", Guidelines for Use in Primary Care, Second Edition.  World Pharmacologist [facility]). Score between 0-7:  no or low risk or alcohol related problems. Score between 8-15:  moderate risk of alcohol related problems. Score between 16-19:  high risk of alcohol related problems. Score 20 or above:  warrants further diagnostic evaluation for alcohol dependence and treatment.   CLINICAL FACTORS:   Severe Anxiety and/or Agitation Depression:   Insomnia Severe Unstable or Poor Therapeutic Relationship Previous Psychiatric Diagnoses and Treatments   Musculoskeletal: Strength & Muscle Tone: within normal limits Gait & Station: normal Patient leans: N/A  Psychiatric Specialty Exam: Physical Exam  Nursing note and vitals reviewed.   Review of Systems  Constitutional: Negative for chills and fever.  Respiratory: Negative for cough and shortness of breath.   Cardiovascular: Negative for chest pain.  Gastrointestinal: Negative for abdominal pain, heartburn, nausea and vomiting.  Psychiatric/Behavioral: Positive for depression and suicidal ideas. Negative for hallucinations. The patient is nervous/anxious. The patient does not have insomnia.     Blood pressure (!) 129/92, pulse  90, temperature 97.9 F (36.6 C), temperature source Oral, resp.  rate 18, height 5\' 3"  (1.6 m), weight 49.4 kg (109 lb).Body mass index is 19.31 kg/m.  General Appearance: Casual and Fairly Groomed  Eye Contact:  Good  Speech:  Clear and Coherent and Normal Rate  Volume:  Normal  Mood:  Depressed  Affect:  Appropriate, Congruent and Constricted  Thought Process:  Coherent and Goal Directed  Orientation:  Full (Time, Place, and Person)  Thought Content:  Logical  Suicidal Thoughts:  No  Homicidal Thoughts:  No  Memory:  Immediate;   Fair Recent;   Fair Remote;   Fair  Judgement:  Poor  Insight:  Fair  Psychomotor Activity:  Normal  Concentration:  Concentration: Fair  Recall:  [employer] of Knowledge:  Fair  Language:  Fair  Akathisia:  No  Handed:    AIMS (if indicated):     Assets:  Resilience Social Support  ADL's:  Intact  Cognition:  WNL  Sleep:  Number of Hours: 6.75      COGNITIVE FEATURES THAT CONTRIBUTE TO RISK:  None    SUICIDE RISK:   Moderate:  Frequent suicidal ideation with limited intensity, and duration, some specificity in terms of plans, no associated intent, good self-control, limited dysphoria/symptomatology, some risk factors present, and identifiable protective factors, including available and accessible social support.  PLAN OF CARE:   -Admit to inpatient level of care  -Major depressive disorder, recurrent, severe, without psychosis   -Continue Effexor XR 150mg  po qDay   -Start abilify 10mg  po qDay  -Anxiety    -Continue vistaril 25mg  po q6h prn anxiety  -Insomnia  -Continue trazodone 50mg  po qhs prn insomnia  -UTI  -Continue macrobid 100mg  po q12h for 9 doses  -Encourage participation in groups and therapeutic milieu  -Disposition planning will be ongoing  I certify that inpatient services furnished can reasonably be expected to improve the patient's condition.   Pennelope Bracken, MD 12/29/2017, 3:40 PM

## 2017-12-29 NOTE — BHH Counselor (Signed)
Adult Comprehensive Assessment  Patient ID: Diana Hernandez, female   DOB: [birth year], 62 y.o.   MRN: [id number]  Information Source: Information source: Patient  Current Stressors:  Patient states their goals for this hospitilization and ongoing recovery are:: "to get on medication for my depression that actually works."  Physical health (include injuries & life threatening diseases): UTI bladder infection. otherwise I'm healthy.  Bereavement / Loss: none identified  Living/Environment/Situation:  Living Arrangements: Spouse/significant other Living conditions (as described by patient or guardian): we live in house. "I like it there." Who else lives in the home?: husband How long has patient lived in current situation?: 5 years  What is atmosphere in current home: Comfortable  Family History:  Marital status: Married Number of Years Married: 4 What types of issues is patient dealing with in the relationship?: My husband and I were married for about 12 years; separated for about four years and got back together about 4 years ago Additional relationship information: "we are doing fine."  Are you sexually active?: Yes What is your sexual orientation?: heterosexual Has your sexual activity been affected by drugs, alcohol, medication, or emotional stress?: n/a Does patient have children?: Yes How many children?: 1 How is patient's relationship with their children?: 5yo son. "He lives in [place]. We have a good relationship."  Childhood History:  By whom was/is the patient raised?: Both parents Additional childhood history information: My mom and daddy raised me. "good childhood."  Description of patient's relationship with caregiver when they were a child: close to both parents Patient's description of current relationship with people who raised him/her: mom remarried; we are close; dad living on his own and doing well.  How were you disciplined when you got in trouble as a  child/adolescent?: popped my butt or grounded.  Does patient have siblings?: Yes Number of Siblings: 3 Description of patient's current relationship with siblings: 2 brothers and 1 sister. "I'm the oldest." "I'm close to my brothers." Did patient suffer any verbal/emotional/physical/sexual abuse as a child?: No Did patient suffer from severe childhood neglect?: No Has patient ever been sexually abused/assaulted/raped as an adolescent or adult?: No Was the patient ever a victim of a crime or a disaster?: No Witnessed domestic violence?: No Has patient been effected by domestic violence as an adult?: No  Education:  Highest grade of school patient has completed: graduated high school Currently a student?: No Learning disability?: (I passed but struggled. 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) How long has patient been employed?: 36 years  Patient's job has been impacted by current illness: No What is the longest time patient has a held a job?: 36 years  Where was the patient employed at that time?: see above  Did You Receive Any Psychiatric Treatment/Services While in the [employer]?: (n/a) Are There Guns or Other Weapons in [place]?: No Are These Weapons Safely Secured?: (n/a)  Financial Resources:   Financial resources: Income from spouse, Income from employment, Private insurance(my husband does lawn care) Does patient have a Programmer, applications or guardian?: No  Alcohol/Substance Abuse:   What has been your use of drugs/alcohol within the last 12 months?: no drug or alcohol use.  If attempted suicide, did drugs/alcohol play a role in this?: Yes("I tried to overdose before I came in here. I had one other attempt 4-5 years ago." ) Alcohol/Substance Abuse Treatment Hx: Denies past history If yes, describe treatment:  n/a Has alcohol/substance abuse ever caused legal problems?:  No  Social Support System:   Patient's Community Support System: Good Describe Community Support System: "I have close friends at work."  Type of faith/religion: n/a  How does patient's faith help to cope with current illness?: n/a  Leisure/Recreation:   Leisure and Hobbies: "nothing."   Strengths/Needs:   Patient states these barriers may affect their return to the community: "nothing."  Other important information patient would like considered in planning for their treatment: "nothing."   Discharge Plan:   Currently receiving community mental health services: No Patient states concerns and preferences for aftercare planning are: none Patient states they will know when they are safe and ready for discharge when: "I feel less hopeless and depressed."  Does patient have access to transportation?: Yes Does patient have financial barriers related to discharge medications?: No Patient description of barriers related to discharge medications: none  Will patient be returning to same living situation after discharge?: Yes  Summary/Recommendations:   Summary and Recommendations (to be completed by the evaluator): Patient is 62yo female [place] [facility] [place] [place] [facility]. Patient presents to the hospital seeking treatment for intentional overdose/suicide attempt, increased depression, and for medication stabilization. patient reports chronic depression and states tht her depresion medication is no longer working. She has a primary diagnosis of MDD. Pt denies substance abuse. She is employed, married, and has one adult son. Patient plans to return home at discharge and would like to follow-up with her PCP. She is declining referral for therapy or psychiatry. Recommendations for patient include: crisis stabilization, therapeutic milieu, encourage group attendance and participation, medication management for mood stabilization, and development of comprehensive mental wellness plan.  CSW assessing.   Avelina Laine LCSW 12/29/2017 10:13 AM

## 2017-12-29 NOTE — Progress Notes (Signed)
Adult Psychoeducational Group Note  Date:  12/29/2017 Time:  11:20 PM  Group Topic/Focus:  Wrap-Up Group:   The focus of this group is to help patients review their daily goal of treatment and discuss progress on daily workbooks.  Participation Level:  Active  Participation Quality:  Appropriate  Affect:  Appropriate  Cognitive:  Appropriate  Insight: Appropriate  Engagement in Group:  Engaged  Modes of Intervention:  Discussion  Additional Comments:  Patient attended group and said that his day was a 5. Her coping skills for today was sleeping.   Hallie Ishida W Emberleigh Reily 01/20/8888, 11:20 PM

## 2017-12-29 NOTE — Progress Notes (Signed)
D: Patient is minimal and not forthcoming with staff.  She denies she took an overdose, however, stated that she did.  She is denying any depressive symptoms or thoughts of self harm.  She is isolative to her room.  Patient reports, per self inventory, that she is sleeping and eating well.  Her energy level is low and her concentration is poor.  A: Continue to monitor medication management and MD orders.  Safety checks completed every 15 minutes per protocol.  Offer support and encouragement as needed.  R: Patient is receptive to staff; her behavior is appropriate.

## 2017-12-29 NOTE — H&P (Addendum)
Psychiatric Admission Assessment Adult  Patient Identification: RAKHI ROMAGNOLI  MRN:  [id number]  Date of Evaluation:  12/29/2017  Chief Complaint: Suicide attempt by overdose on sleeping pills.  Principal Diagnosis: MDD (major depressive disorder), recurrent episode, severe ([place])  Diagnosis:   Patient Active Problem List   Diagnosis Date Noted  . MDD (major depressive disorder), recurrent episode, severe ([place]) [F33.2] 12/28/2017    Priority: High   History of Present Illness: This is an admission assessment for this 62 year old Caucasian female with hx of depression. Admitted to the [facility] from the [facility] with complaints of suicide attempt by overdose on an over the counter sleep aid #69 tablets. Chart review indicated that she cited her distraught over conflicts in her family as the trigger. She does have hx of previous suicide 5 years ago.  During this assessment; Lena reports, "My [name] took me to the hospital yesterday. I had tried to overdose on some sleep medicines. I bought a bottle of sleeping pills containing 69 pills. I took all of them, drove myself to a packing lot. Then, it happened that everybody was looking for me. My girlfriend's daughter found me, called her mother who came & took me home. I don't remember much of anything else. The next day, My mama came by, gave me a choice to go to the hospital or get committed. I attempted suicide because I'm tired of living so I tried to end it. I have no reason to continue to live. All I do is work & sleep. I had attempted suicide 5 years ago by taking bunch of pills as well. 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 Later, her father came & got this kid because he said he was in a position to care for this young girl. He was later charged with indecent liberty with a minor against this little girl. I blamed myself because I thought I should not have let this  child go with her father who I did not know was a child molester. I have been feeling suicidal for the last 2 weeks. So, on this past Monday, I decided to do it. I was diagnosed with depression 6 years ago. I have been on Effexor. Life is boring. There is no enjoyment in life. I was seeing Dr. Scotty Court"   Associated Signs/Symptoms:  Depression Symptoms:  depressed mood, feelings of worthlessness/guilt, hopelessness, weight loss,  (Hypo) Manic Symptoms:  Impulsivity, Labiality of Mood,  Anxiety Symptoms:  Excessive Worry,  Psychotic Symptoms:  Denies any hallucinations, delusions or paranoia.  PTSD Symptoms: NA  Total Time spent with patient: 1 hour  Past Psychiatric History: Major depression.  Is the patient at risk to self? No.  Has the patient been a risk to self in the past 6 months? Yes.    Has the patient been a risk to self within the distant past? Yes.    Is the patient a risk to others? No.  Has the patient been a risk to others in the past 6 months? No.  Has the patient been a risk to others within the distant past? No.   Prior Inpatient Therapy: Yes Prior Outpatient Therapy: Yes  Alcohol Screening: 1. How often do you have a drink containing alcohol?: 2 to 4 times a month 2. How many drinks containing alcohol do you have on a typical day when you are drinking?: 1  or 2 3. How often do you have six or more drinks on one occasion?: Less than monthly AUDIT-C Score: 3 4. How often during the last year have you found that you were not able to stop drinking once you had started?: Never 5. How often during the last year have you failed to do what was normally expected from you becasue of drinking?: Never 6. How often during the last year have you needed a first drink in the morning to get yourself going after a heavy drinking session?: Never 7. How often during the last year have you had a feeling of guilt of remorse after drinking?: Never 8. How often during the last year  have you been unable to remember what happened the night before because you had been drinking?: Never 9. Have you or someone else been injured as a result of your drinking?: No 10. Has a relative or friend or a doctor or another health worker been concerned about your drinking or suggested you cut down?: No Alcohol Use Disorder Identification Test Final Score (AUDIT): 3 Intervention/Follow-up: AUDIT Score <7 follow-up not indicated  Substance Abuse History in the last 12 months:  No.  Consequences of Substance Abuse: NA  Previous Psychotropic Medications: Yes, (Effexor XR).  Psychological Evaluations: No   Past Medical History:  Past Medical History:  Diagnosis Date  . Cancer ([place])   . Depression   . Suicide attempt [facility])     Past Surgical History:  Procedure Laterality Date  . BREAST SURGERY     Family History: History reviewed. No pertinent family history. Family Psychiatric  History: Drug abuse/dependence: Son.  Tobacco Screening: Have you used any form of tobacco in the last 30 days? (Cigarettes, Smokeless Tobacco, Cigars, and/or Pipes): Yes Tobacco use, Select all that apply: 5 or more cigarettes per day Are you interested in Tobacco Cessation Medications?: Yes, will notify MD for an order Counseled patient on smoking cessation including recognizing danger situations, developing coping skills and basic information about quitting provided: Refused/Declined practical counseling  Social History:  Social History   Substance and Sexual Activity  Alcohol Use Yes   Comment: occ     Social History   Substance and Sexual Activity  Drug Use No    Additional Social History: Marital status: Married Number of Years Married: 4 What types of issues is patient dealing with in the relationship?: My husband and I were married for about 12 years; separated for about four years and got back together about 4 years ago Additional relationship information: "we are doing fine."  Are  you sexually active?: Yes What is your sexual orientation?: heterosexual Has your sexual activity been affected by drugs, alcohol, medication, or emotional stress?: n/a Does patient have children?: Yes How many children?: 1 How is patient's relationship with their children?: 35yo son. "He lives in [place]. We have a good relationship."   Allergies:   Allergies  Allergen Reactions  . Sulfa Antibiotics    Lab Results:  Results for orders placed or performed during the hospital encounter of 12/28/17 (from the past 48 hour(s))  Urine rapid drug screen (hosp performed)     Status: None   Collection Time: 12/28/17 11:28 AM  Result Value Ref Range   Opiates NONE DETECTED NONE DETECTED   Cocaine NONE DETECTED NONE DETECTED   Benzodiazepines NONE DETECTED NONE DETECTED   Amphetamines NONE DETECTED NONE DETECTED   Tetrahydrocannabinol NONE DETECTED NONE DETECTED   Barbiturates NONE DETECTED NONE DETECTED    Comment: (  NOTE) DRUG SCREEN FOR MEDICAL PURPOSES ONLY.  IF CONFIRMATION IS NEEDED FOR ANY PURPOSE, NOTIFY LAB WITHIN 5 DAYS. LOWEST DETECTABLE LIMITS FOR URINE DRUG SCREEN Drug Class                     Cutoff (ng/mL) Amphetamine and metabolites    1000 Barbiturate and metabolites    200 Benzodiazepine                 696 Tricyclics and metabolites     300 Opiates and metabolites        300 Cocaine and metabolites        300 THC                            50 Performed at [facility], [address], [place], [place] [address]   Urinalysis, Routine w reflex microscopic     Status: Abnormal   Collection Time: 12/28/17 11:28 AM  Result Value Ref Range   Color, Urine YELLOW YELLOW   APPearance HAZY (A) CLEAR   Specific Gravity, Urine 1.012 1.005 - 1.030   pH 7.0 5.0 - 8.0   Glucose, UA NEGATIVE NEGATIVE mg/dL   Hgb urine dipstick NEGATIVE NEGATIVE   Bilirubin Urine NEGATIVE NEGATIVE   Ketones, ur NEGATIVE NEGATIVE mg/dL   Protein, ur NEGATIVE NEGATIVE mg/dL   Nitrite NEGATIVE  NEGATIVE   Leukocytes, UA MODERATE (A) NEGATIVE   RBC / HPF 0-5 0 - 5 RBC/hpf   WBC, UA 6-10 0 - 5 WBC/hpf   Bacteria, UA NONE SEEN NONE SEEN   Squamous Epithelial / LPF 0-5 0 - 5   Mucus PRESENT    Uric Acid Crys, UA PRESENT     Comment: Performed at [facility], [address], [place], [place] [address]  Comprehensive metabolic panel     Status: Abnormal   Collection Time: 12/28/17 11:38 AM  Result Value Ref Range   Sodium 140 135 - 145 mmol/L   Potassium 3.5 3.5 - 5.1 mmol/L   Chloride 102 101 - 111 mmol/L   CO2 29 22 - 32 mmol/L   Glucose, Bld 104 (H) 65 - 99 mg/dL   BUN 16 6 - 20 mg/dL   Creatinine, Ser 0.68 0.44 - 1.00 mg/dL   Calcium 8.9 8.9 - 10.3 mg/dL   Total Protein 6.9 6.5 - 8.1 g/dL   Albumin 3.9 3.5 - 5.0 g/dL   AST 16 15 - 41 U/L   ALT 16 14 - 54 U/L   Alkaline Phosphatase 96 38 - 126 U/L   Total Bilirubin 0.7 0.3 - 1.2 mg/dL   GFR calc non Af Amer >60 >60 mL/min   GFR calc Af Amer >60 >60 mL/min    Comment: (NOTE) The eGFR has been calculated using the CKD EPI equation. This calculation has not been validated in all clinical situations. eGFR's persistently <60 mL/min signify possible Chronic Kidney Disease.    Anion gap 9 5 - 15    Comment: Performed at [facility], [address], [place], [place] [address]  Ethanol     Status: None   Collection Time: 12/28/17 11:38 AM  Result Value Ref Range   Alcohol, Ethyl (B) <10 <10 mg/dL    Comment: (NOTE) Lowest detectable limit for serum alcohol is 10 mg/dL. For medical purposes only. Performed at [facility], [address], [place],  [address]   CBC with [facility]  Status: None   Collection Time: 12/28/17 11:38 AM  Result Value Ref Range   WBC 9.3 4.0 - 10.5 K/uL   RBC 4.62 3.87 - 5.11 MIL/uL   Hemoglobin 13.1 12.0 - 15.0 g/dL   HCT 41.4 36.0 - 46.0 %   MCV 89.6 78.0 - 100.0 fL   MCH 28.4 26.0 - 34.0 pg   MCHC 31.6 30.0 - 36.0 g/dL   RDW 13.3 11.5 - 15.5 %   Platelets 360 150 - 400 K/uL    Neutrophils Relative % 55 %   Neutro Abs 5.2 1.7 - 7.7 K/uL   Lymphocytes Relative 34 %   Lymphs Abs 3.1 0.7 - 4.0 K/uL   Monocytes Relative 8 %   Monocytes Absolute 0.8 0.1 - 1.0 K/uL   Eosinophils Relative 2 %   Eosinophils Absolute 0.2 0.0 - 0.7 K/uL   Basophils Relative 1 %   Basophils Absolute 0.1 0.0 - 0.1 K/uL    Comment: Performed at [facility], [address], [place], [place] [address]  Salicylate level     Status: None   Collection Time: 12/28/17 11:38 AM  Result Value Ref Range   Salicylate Lvl <4.2 2.8 - 30.0 mg/dL    Comment: Performed at [facility], [address], [place], [place] [address]  Acetaminophen level     Status: Abnormal   Collection Time: 12/28/17 11:38 AM  Result Value Ref Range   Acetaminophen (Tylenol), Serum <10 (L) 10 - 30 ug/mL    Comment: (NOTE) Therapeutic concentrations vary significantly. A range of 10-30 ug/mL  may be an effective concentration for many patients. However, some  are best treated at concentrations outside of this range. Acetaminophen concentrations >150 ug/mL at 4 hours after ingestion  and >50 ug/mL at 12 hours after ingestion are often associated with  toxic reactions. Performed at [facility], [address], [place], [place] [address]    Blood Alcohol level:  Lab Results  Component Value Date   ETH <10 20/35/5974   Metabolic Disorder Labs:  No results found for: HGBA1C, MPG No results found for: PROLACTIN No results found for: CHOL, TRIG, HDL, CHOLHDL, VLDL, LDLCALC  Current Medications: Current Facility-Administered Medications  Medication Dose Route Frequency Provider Last Rate Last Dose  . nicotine polacrilex (NICORETTE) gum 2 mg  2 mg Oral PRN Cobos, Myer Peer, MD      . nitrofurantoin (macrocrystal-monohydrate) (MACROBID) capsule 100 mg  100 mg Oral Q12H Rankin, Shuvon B, NP   100 mg at 12/29/17 1036   PTA Medications: Medications Prior to Admission  Medication Sig Dispense Refill Last Dose  .  venlafaxine XR (EFFEXOR-XR) 75 MG 24 hr capsule Take 75 mg by mouth 4 (four) times daily.   12/28/2017 at Unknown time   Musculoskeletal: Strength & Muscle Tone: within normal limits Gait & Station: normal Patient leans: N/A  Psychiatric Specialty Exam: Physical Exam  Constitutional: She appears well-developed.  HENT:  Head: Normocephalic.  Eyes: Pupils are equal, round, and reactive to light.  Neck: Normal range of motion.  Cardiovascular: Normal rate.  Respiratory: Effort normal.  GI: Soft.  Genitourinary:  Genitourinary Comments: Deferred  Musculoskeletal: Normal range of motion.  Neurological: She is alert.  Skin: Skin is warm.    Review of Systems  Constitutional: Positive for weight loss. Negative for malaise/fatigue.  HENT: Negative.   Eyes: Negative.   Respiratory: Negative.  Negative for cough and shortness of breath.   Cardiovascular: Negative.  Negative for chest pain and palpitations.  Gastrointestinal: Negative.   Genitourinary: Negative.   Musculoskeletal: Negative.   Skin: Negative.   Neurological: Negative.   Endo/Heme/Allergies: Negative.   Psychiatric/Behavioral: Positive for depression. Negative for hallucinations, memory loss, substance abuse and suicidal ideas. The patient is not nervous/anxious and does not have insomnia.     Blood pressure (!) 129/92, pulse 90, temperature 97.9 F (36.6 C), temperature source Oral, resp. rate 18, height 5' 3" (1.6 m), weight 49.4 kg (109 lb).Body mass index is 19.31 kg/m.  General Appearance: Casual, thin frame, in a hospital scrub.  Eye Contact:  Good  Speech:  Clear and Coherent and Normal Rate  Volume:  Normal  Mood:  Reports mild symptoms of depression (sadness & hopelessness).  Affect:  Flat  Thought Process:  Coherent, Linear and Descriptions of Associations: Intact  Orientation:  Full (Time, Place, and Person)  Thought Content:  Rumination, denies any hallucinations.  Suicidal Thoughts:  Currently denies  any hallucinations, delusions or paranoia.  Homicidal Thoughts:  Denies  Memory:  Immediate;   Fair Recent;   Fair Remote;   Fair  Judgement:  Fair  Insight:  Fair  Psychomotor Activity:  Decreased  Concentration:  Concentration: Good and Attention Span: Good  Recall:  [employer] of Knowledge:  Fair  Language:  Good  Akathisia:  No  Handed:  Right  AIMS (if indicated):     Assets:  Communication Skills Desire for Improvement  ADL's:  Intact  Cognition:  WNL  Sleep:  Number of Hours: 6.75   Treatment Plan/Recommendations: 1. Admit for crisis management and stabilization, estimated length of stay 3-5 days.   2. Medication management to reduce current symptoms to base line and improve the patient's overall level of functioning: See MAR, Md's SRA & treatment plan.   Observation Level/Precautions:  15 minute checks  Laboratory:  Per ED, UDS clear  Psychotherapy: Group sessions   Medications: See MAR.  Consultations: As needed.  Discharge Concerns: Safety, mood stability.   Estimated LOS: 3-5 days  Other: Admit to the 300-hall.   Physician Treatment Plan for Primary Diagnosis: MDD (major depressive disorder), recurrent episode, severe ([place]) Long Term Goal(s): Improvement in symptoms so as ready for discharge  Short Term Goals: Ability to verbalize feelings will improve and Ability to disclose and discuss suicidal ideas  Physician Treatment Plan for Secondary Diagnosis: Principal Problem:   MDD (major depressive disorder), recurrent episode, severe ([place])  Long Term Goal(s): Improvement in symptoms so as ready for discharge  Short Term Goals: Ability to identify and develop effective coping behaviors will improve and Compliance with prescribed medications will improve  I certify that inpatient services furnished can reasonably be expected to improve the patient's condition.    Lindell Spar, NP, PMHNP, FNP-BC 6/13/20193:24 PM   I have reviewed NP's Note, assessement,  diagnosis and plan, and agree. I have also met with patient and completed suicide risk assessment.    Sharion Grieves is a 62 y/o F with history of MDD who was admitted voluntarily from [facility] ED where she was referred from her primary care office after she attempted suicide on 6/10 by taking 96 tablets of an OTC sleep aid. Pt was found by her friend's daughter and not immediately taken for medical evaluation. Pt was medically cleared and then transferred to [facility] for additional treatment and evaluation.  Upon initial evaluation, pt shares, "I'm depressed, and I tried to take so many sleeping pills. Just seems like everything is on repeat." Pt goes on to  describe feeling anhedonic about her lifestyle of working on most days evening shifts and then returning home to take her son to his night shift. Pt reports she sleeps poorly because she has to get up early to pick up her son from work at [employer]. She endorses additional depression symptoms of guilty feelings, low energy, and psychomotor retardation. She is unsure how long she has been thinking about suicide, but she states, "It's been in the back of my mind." She has previous attempt via overdose on xanax. She denies current SI/HI/AH/VH. She denies symptoms of mania, OCD, and PTSD. She reports using alcohol on weekends about 2-4 drinks on those occassions, and she denies other illicit substance use.   Discussed with patient about treatment options. She reports good adherence to home medication of Effexor prescribed by her PCP. She feels it is partially effective, but has decreased efficacy lately. We discussed option of augmentation with abilify, and pt was in agreement. She had no further questions, comments or concerns.   PLAN OF CARE:   -Admit to inpatient level of care  -Major depressive disorder, recurrent, severe, without psychosis             -Continue Effexor XR 148m po qDay             -Start abilify 128mpo qDay  -Anxiety                         -Continue vistaril 2569mo q6h prn anxiety  -Insomnia             -Continue trazodone 36m35m qhs prn insomnia  -UTI             -Continue macrobid [id number] for 9 doses  -Encourage participation in groups and therapeutic milieu  -Disposition planning will be ongoing   ChrisMaris Berger

## 2017-12-29 NOTE — BHH Group Notes (Signed)
LCSW Group Therapy Note  12/29/2017 1:15pm  Type of Therapy/Topic:  Group Therapy:  Feelings about Diagnosis  Participation Level:  Did Not Attend--pt invited. Chose to remain in bed.    Description of Group:   This group will allow patients to explore their thoughts and feelings about diagnoses they have received. Patients will be guided to explore their level of understanding and acceptance of these diagnoses. Facilitator will encourage patients to process their thoughts and feelings about the reactions of others to their diagnosis and will guide patients in identifying ways to discuss their diagnosis with significant others in their lives. This group will be process-oriented, with patients participating in exploration of their own experiences, giving and receiving support, and processing challenge from other group members.   Therapeutic Goals: 1. Patient will demonstrate understanding of diagnosis as evidenced by identifying two or more symptoms of the disorder 2. Patient will be able to express two feelings regarding the diagnosis 3. Patient will demonstrate their ability to communicate their needs through discussion and/or role play  Summary of Patient Progress: x  Therapeutic Modalities:   Cognitive Behavioral Therapy Brief Therapy Feelings Identification    Avelina Laine, LCSW 12/29/2017 11:43 AM

## 2017-12-29 NOTE — Progress Notes (Signed)
D   Pt in her room in bed the entire evening   She would not come to group or to get medications    She reports feeling bad   Pt educated about her UTI and given antibiotic   She reports being depressed  A    Verbal support given   Medications administered and effectiveness monitored    Q 15 min checks R   Pt is safe at present time

## 2017-12-29 NOTE — Progress Notes (Signed)
Pt did not attend goals/orientation group this morning.  

## 2017-12-29 NOTE — Plan of Care (Signed)
  Problem: Education: Goal: Emotional status will improve Outcome: Progressing Goal: Mental status will improve Outcome: Progressing Goal: Verbalization of understanding the information provided will improve Outcome: Progressing   Problem: Education: Goal: Emotional status will improve Outcome: Progressing Goal: Mental status will improve Outcome: Progressing Goal: Verbalization of understanding the information provided will improve Outcome: Progressing

## 2017-12-30 MED ORDER — ALUM & MAG HYDROXIDE-SIMETH 200-200-20 MG/5ML PO SUSP: 30.0000 mL | Freq: Four times a day (QID) | ORAL | Status: DC | PRN

## 2017-12-30 MED ORDER — LOPERAMIDE HCL 2 MG PO CAPS: 4.0000 mg | ORAL_CAPSULE | ORAL | Status: DC | PRN

## 2017-12-30 MED ORDER — ACETAMINOPHEN 325 MG PO TABS: 650.0000 mg | ORAL_TABLET | Freq: Four times a day (QID) | ORAL | Status: DC | PRN

## 2017-12-30 MED ORDER — FAMOTIDINE 20 MG PO TABS: 20.0000 mg | ORAL_TABLET | Freq: Two times a day (BID) | ORAL | Status: DC | PRN

## 2017-12-30 MED ORDER — BISACODYL 5 MG PO TBEC
5.0000 mg | DELAYED_RELEASE_TABLET | Freq: Every day | ORAL | Status: DC | PRN
  Administered 2017-12-30 – 2017-12-31 (×2): 5 mg via ORAL
  Filled 2017-12-30 (×2): qty 1

## 2017-12-30 MED ORDER — TRAZODONE HCL 50 MG PO TABS
50.0000 mg | ORAL_TABLET | Freq: Every evening | ORAL | Status: DC | PRN
  Filled 2017-12-30 (×7): qty 1

## 2017-12-30 MED ORDER — IBUPROFEN 600 MG PO TABS: 600.0000 mg | ORAL_TABLET | Freq: Four times a day (QID) | ORAL | Status: DC | PRN

## 2017-12-30 NOTE — BHH Group Notes (Signed)
LCSW Group Therapy Note   12/30/2017 1:15pm   Type of Therapy and Topic:  Group Therapy:  Overcoming Obstacles   Participation Level:  Did Not Attend--pt invited. Chose to remain in bed.    Description of Group:    In this group patients will be encouraged to explore what they see as obstacles to their own wellness and recovery. They will be guided to discuss their thoughts, feelings, and behaviors related to these obstacles. The group will process together ways to cope with barriers, with attention given to specific choices patients can make. Each patient will be challenged to identify changes they are motivated to make in order to overcome their obstacles. This group will be process-oriented, with patients participating in exploration of their own experiences as well as giving and receiving support and challenge from other group members.   Therapeutic Goals: 1. Patient will identify personal and current obstacles as they relate to admission. 2. Patient will identify barriers that currently interfere with their wellness or overcoming obstacles.  3. Patient will identify feelings, thought process and behaviors related to these barriers. 4. Patient will identify two changes they are willing to make to overcome these obstacles:      Summary of Patient Progress   x   Therapeutic Modalities:   Cognitive Behavioral Therapy Solution Focused Therapy Motivational Interviewing Relapse Prevention Therapy  Avelina Laine, [place] 12/30/2017 2:03 PM

## 2017-12-30 NOTE — Progress Notes (Signed)
D: Patient denies SI, HI or AVH this evening. Patient presents disheveled, cautious and blunted on approach and is minimal with her responses.  Pt. States she did not have a goal today other than, "to get out of here".  Pt. Is visualized in the dayroom but is minimally interactive, she attended evening wrap up group and did participate.  Pt. Denies any physical complaints and denied any needs.  A: Patient given emotional support from RN. Patient encouraged to come to staff with concerns and/or questions. Patient's medication routine continued. Patient's orders and plan of care reviewed.   R: Patient remains appropriate and cooperative. Will continue to monitor patient q15 minutes for safety.

## 2017-12-30 NOTE — Plan of Care (Signed)
  Problem: Coping: Goal: Ability to demonstrate self-control will improve Outcome: Adequate for Discharge   Problem: Safety: Goal: Periods of time without injury will increase Outcome: Adequate for Discharge   Problem: Activity: Goal: Sleeping patterns will improve Outcome: Progressing

## 2017-12-30 NOTE — Progress Notes (Signed)
Writer spoke with patient 1:1 and she reports feeling worse since finding out she would not be discharging on today. She reports that she just wants to get back to work. Writer encouraged her to try and make the best of the news and she smiled and reports that she doesn't have a choice. Writer informed her of prns available along with her scheduled medication. Support given and safety maintained on unit with 15 min checks. but when asked what she

## 2017-12-30 NOTE — Progress Notes (Signed)
D: Patient became visibly upset this morning after finding out that she would not be discharged.  She was on the phone and started yelling.  Patient states, "I just want to get out of here."  Explained that due to the seriousness of her overdose, she needs to stay longer to make sure she is safe to leave.  Patient denies any depressive symptoms, however, she appears very flat, blunted.  She is isolative to her room coming out only for meals and medications.  She denies any thoughts of self harm.  A: Continue to monitor medication management and MD orders.  Safety checks completed every 15 minutes per protocol.  Offer support and encouragement as needed.  R: Patient remains isolative and withdrawn.

## 2017-12-30 NOTE — Progress Notes (Signed)
Pt attended goals and orientation group this morning. Pt goal for the day is to go home and see her doctor.

## 2017-12-30 NOTE — Progress Notes (Signed)
[facility] MD Progress Note  12/30/2017 12:43 PM [name]  MRN:  [id number] Subjective:    Diana Hernandez is a 62 y/o F with history of MDD who was admitted voluntarily from [facility] ED where she was referred from her primary care office after she attempted suicide on 6/10 by taking 96 tablets of an OTC sleep aid. Pt was found by her friend's daughter and not immediately taken for medical evaluation. Pt was medically cleared and then transferred to [facility] for additional treatment and evaluation. She was restarted on home medication of effexor XR and additionally started on trial of abilify to address her mood symptoms.  Today upon evaluation, pt shares, "I just want to go home, this place is driving me crazy." Pt is irritable, and she reports craving for nicotine, for which she was reminded that she has nicotine gum available PRN. Pt also reports being upset that she cannot recall the number of her mother to give her a phone call, and we discussed that she can work with RN staff to obtain her mother's phone number to give her a call. Pt was asked about her mood symptoms and SI, and she replies, "There is no reason to live." Talked to patient about her son with whom she lives, and to reach out to him and discuss her thoughts, and she verbalized good understanding. She is able to contract for safety while in the hospital. She denies HI/AH/VH. She is tolerating her medications well, and she denies side effects. She denies other physical complaints. Discussed with patient that we will plan to continue her current regimen without changes at this time, and continue to monitor her on the inpatient unit. Pt verbalized understanding. She had no further questions, comments, or concerns.  Principal Problem: MDD (major depressive disorder), recurrent episode, severe ([place]) Diagnosis:   Patient Active Problem List   Diagnosis Date Noted  . MDD (major depressive disorder), recurrent episode, severe ([place]) [F33.2] 12/28/2017    Total Time spent with patient: 30 minutes  Past Psychiatric History: see H&P  Past Medical History:  Past Medical History:  Diagnosis Date  . Cancer ([place])   . Depression   . Suicide attempt [facility])     Past Surgical History:  Procedure Laterality Date  . BREAST SURGERY     Family History: History reviewed. No pertinent family history. Family Psychiatric  History: see H&P Social History:  Social History   Substance and Sexual Activity  Alcohol Use Yes   Comment: occ     Social History   Substance and Sexual Activity  Drug Use No    Social History   Socioeconomic History  . Marital status: Divorced    Spouse name: Not on file  . Number of children: Not on file  . Years of education: Not on file  . Highest education level: Not on file  Occupational History  . Not on file  Social Needs  . Financial resource strain: Not on file  . Food insecurity:    Worry: Not on file    Inability: Not on file  . Transportation needs:    Medical: Not on file    Non-medical: Not on file  Tobacco Use  . Smoking status: Current Every Day Smoker  . Smokeless tobacco: Never Used  Substance and Sexual Activity  . Alcohol use: Yes    Comment: occ  . Drug use: No  . Sexual activity: Yes  Lifestyle  . Physical activity:    Days per week:  Not on file    Minutes per session: Not on file  . Stress: Not on file  Relationships  . Social connections:    Talks on phone: Not on file    Gets together: Not on file    Attends religious service: Not on file    Active member of club or organization: Not on file    Attends meetings of clubs or organizations: Not on file    Relationship status: Not on file  Other Topics Concern  . Not on file  Social History Narrative  . Not on file   Additional Social History:                         Sleep: Good  Appetite:  Good  Current Medications: Current Facility-Administered Medications  Medication Dose Route Frequency Provider  Last Rate Last Dose  . ARIPiprazole (ABILIFY) tablet 10 mg  10 mg Oral Daily Lindell Spar I, NP   10 mg at 12/30/17 0749  . hydrOXYzine (ATARAX/VISTARIL) tablet 25 mg  25 mg Oral Q6H PRN Lindell Spar I, NP      . nicotine polacrilex (NICORETTE) gum 2 mg  2 mg Oral PRN Cobos, Myer Peer, MD      . nitrofurantoin (macrocrystal-monohydrate) (MACROBID) capsule 100 mg  100 mg Oral Q12H Rankin, Shuvon B, NP   100 mg at 12/30/17 1029  . traZODone (DESYREL) tablet 50 mg  50 mg Oral QHS PRN Lindell Spar I, NP      . venlafaxine XR (EFFEXOR-XR) 24 hr capsule 150 mg  150 mg Oral Q breakfast Nwoko, Agnes I, NP   150 mg at 12/30/17 0750    Lab Results: No results found for this or any previous visit (from the past 48 hour(s)).  Blood Alcohol level:  Lab Results  Component Value Date   ETH <10 86/57/8469    Metabolic Disorder Labs: No results found for: HGBA1C, MPG No results found for: PROLACTIN No results found for: CHOL, TRIG, HDL, CHOLHDL, VLDL, LDLCALC  Physical Findings: AIMS: Facial and Oral Movements Muscles of Facial Expression: None, normal Lips and Perioral Area: None, normal Jaw: None, normal Tongue: None, normal,Extremity Movements Upper (arms, wrists, hands, fingers): None, normal Lower (legs, knees, ankles, toes): None, normal, Trunk Movements Neck, shoulders, hips: None, normal, Overall Severity Severity of abnormal movements (highest score from questions above): None, normal Incapacitation due to abnormal movements: None, normal Patient's awareness of abnormal movements (rate only patient's report): No Awareness, Dental Status Current problems with teeth and/or dentures?: No Does patient usually wear dentures?: No  CIWA:    COWS:     Musculoskeletal: Strength & Muscle Tone: within normal limits Gait & Station: normal Patient leans: N/A  Psychiatric Specialty Exam: Physical Exam  Nursing note and vitals reviewed.   Review of Systems  Constitutional: Negative for  chills and fever.  Respiratory: Negative for cough and shortness of breath.   Cardiovascular: Negative for chest pain.  Gastrointestinal: Negative for abdominal pain, heartburn, nausea and vomiting.  Psychiatric/Behavioral: Negative for depression, hallucinations and suicidal ideas. The patient is not nervous/anxious and does not have insomnia.     Blood pressure (!) 122/91, pulse 78, temperature 97.8 F (36.6 C), temperature source Oral, resp. rate 16, height 5\' 3"  (1.6 m), weight 49.4 kg (109 lb).Body mass index is 19.31 kg/m.  General Appearance: Casual and Fairly Groomed  Eye Contact:  Good  Speech:  Clear and Coherent and Normal Rate  Volume:  Normal  Mood:  Depressed and Irritable  Affect:  Congruent and Labile  Thought Process:  Coherent and Goal Directed  Orientation:  Full (Time, Place, and Person)  Thought Content:  Logical  Suicidal Thoughts:  Yes.  without intent/plan  Homicidal Thoughts:  No  Memory:  Immediate;   Fair Recent;   Fair Remote;   Fair  Judgement:  Poor  Insight:  Lacking  Psychomotor Activity:  Normal  Concentration:  Concentration: Fair  Recall:  [employer] of Knowledge:  Fair  Language:  Fair  Akathisia:  No  Handed:    AIMS (if indicated):     Assets:  Resilience Social Support  ADL's:  Intact  Cognition:  WNL  Sleep:  Number of Hours: 5.5   Treatment Plan Summary: Daily contact with patient to assess and evaluate symptoms and progress in treatment and Medication management   -Continue inpatient hospitalization  -Major depressive disorder, recurrent, severe, without psychosis -Continue Effexor XR 150mg  po qDay -Continue abilify 10mg  po qDay  -Anxiety -Continue vistaril 25mg  po q6h prn anxiety  -Insomnia -Continue trazodone 50mg  po qhs prn insomnia  -UTI -Continue macrobid 100mg  po q12h for 9 doses  -Encourage participation in groups and therapeutic  milieu  -Disposition planning will be ongoing  Pennelope Bracken, MD 12/30/2017, 12:43 PM

## 2017-12-30 NOTE — Progress Notes (Signed)
The patient attended the evening A.A.meeting and was appropriate.  

## 2017-12-30 NOTE — [facility] Suicide Risk Assessment (Signed)
[place] INPATIENT:  Family/Significant Other Suicide Prevention Education  Suicide Prevention Education:  Education Completed; Oralia Manis (pt's mother) [phone] has been identified by the patient as the family member/significant other with whom the patient will be residing, and identified as the person(s) who will aid the patient in the event of a mental health crisis (suicidal ideations/suicide attempt).  With written consent from the patient, the family member/significant other has been provided the following suicide prevention education, prior to the and/or following the discharge of the patient.  The suicide prevention education provided includes the following:  Suicide risk factors  Suicide prevention and interventions  National Suicide Hotline telephone number  [facility] assessment telephone number  [facility] Emergency Assistance [place] and/or Residential Mobile Crisis Unit telephone number  Request made of family/significant other to:  Remove weapons (e.g., guns, rifles, knives), all items previously/currently identified as safety concern.    Remove drugs/medications (over-the-counter, prescriptions, illicit drugs), all items previously/currently identified as a safety concern.  The family member/significant other verbalizes understanding of the suicide prevention education information provided.  The family member/significant other agrees to remove the items of safety concern listed above.  SPE and aftercare reviewed with pt's mother. She shared that pt's son has been an enormous stressor for patient. She stated that she has already spoken with pt's son about demanding "so much money and rides to work." Pt's mother has no safety concerns regarding pt discharging home at discharge and confirmed that pt DOES NOT have access to weapons/firearms.   Avelina Laine LCSW 12/30/2017, 2:31 PM

## 2017-12-30 NOTE — Progress Notes (Signed)
Recreation Therapy Notes  Date: 6.14.19 Time: 0930 Location: [address] Dayroom  Group Topic: Stress Management  Goal Area(s) Addresses:  Patient will verbalize importance of using healthy stress management.  Patient will identify positive emotions associated with healthy stress management.   Intervention: Stress Management  Activity :  Progressive Muscle Relaxation.  LRT introduced the stress management technique of progressive muscle relaxation.  LRT read a script to guide patients in doing the activity.  Patients were to follow along as script was read to engage in activity.  Education:  Stress Management, Discharge Planning.   Education Outcome: Acknowledges edcuation/In group clarification offered/Needs additional education  Clinical Observations/Feedback: Pt did not attend group.      Victorino Sparrow, LRT/CTRS         Ria Comment, [place] A 12/30/2017 11:18 AM

## 2017-12-30 NOTE — Tx Team (Signed)
Interdisciplinary Treatment and Diagnostic Plan Update  12/30/2017 Time of Session: [place] MRN: [id number]  Principal Diagnosis: MDD (major depressive disorder), recurrent episode, severe ([place])  Secondary Diagnoses: Principal Problem:   MDD (major depressive disorder), recurrent episode, severe ([place])   Current Medications:  Current Facility-Administered Medications  Medication Dose Route Frequency Provider Last Rate Last Dose  . ARIPiprazole (ABILIFY) tablet 10 mg  10 mg Oral Daily Lindell Spar I, NP   10 mg at 12/30/17 0749  . hydrOXYzine (ATARAX/VISTARIL) tablet 25 mg  25 mg Oral Q6H PRN Lindell Spar I, NP      . nicotine polacrilex (NICORETTE) gum 2 mg  2 mg Oral PRN Cobos, Myer Peer, MD      . nitrofurantoin (macrocrystal-monohydrate) (MACROBID) capsule 100 mg  100 mg Oral Q12H Rankin, Shuvon B, NP   100 mg at 12/29/17 2236  . traZODone (DESYREL) tablet 50 mg  50 mg Oral QHS PRN Lindell Spar I, NP      . venlafaxine XR (EFFEXOR-XR) 24 hr capsule 150 mg  150 mg Oral Q breakfast Lindell Spar I, NP   150 mg at 12/30/17 0750   PTA Medications: Medications Prior to Admission  Medication Sig Dispense Refill Last Dose  . venlafaxine XR (EFFEXOR-XR) 75 MG 24 hr capsule Take 75 mg by mouth 4 (four) times daily.   12/28/2017 at Unknown time    Patient Stressors: Financial difficulties Marital or family conflict  Patient Strengths: Ability for insight Average or above average intelligence Capable of independent living [employer] of knowledge Motivation for treatment/growth  Treatment Modalities: Medication Management, Group therapy, Case management,  1 to 1 session with clinician, Psychoeducation, Recreational therapy.   Physician Treatment Plan for Primary Diagnosis: MDD (major depressive disorder), recurrent episode, severe ([place]) Long Term Goal(s): Improvement in symptoms so as ready for discharge Improvement in symptoms so as ready for discharge   Short Term  Goals: Ability to verbalize feelings will improve Ability to disclose and discuss suicidal ideas Ability to identify and develop effective coping behaviors will improve Compliance with prescribed medications will improve  Medication Management: Evaluate patient's response, side effects, and tolerance of medication regimen.  Therapeutic Interventions: 1 to 1 sessions, Unit Group sessions and Medication administration.  Evaluation of Outcomes: Progressing  Physician Treatment Plan for Secondary Diagnosis: Principal Problem:   MDD (major depressive disorder), recurrent episode, severe ([place])  Long Term Goal(s): Improvement in symptoms so as ready for discharge Improvement in symptoms so as ready for discharge   Short Term Goals: Ability to verbalize feelings will improve Ability to disclose and discuss suicidal ideas Ability to identify and develop effective coping behaviors will improve Compliance with prescribed medications will improve     Medication Management: Evaluate patient's response, side effects, and tolerance of medication regimen.  Therapeutic Interventions: 1 to 1 sessions, Unit Group sessions and Medication administration.  Evaluation of Outcomes: Progressing   RN Treatment Plan for Primary Diagnosis: MDD (major depressive disorder), recurrent episode, severe ([place]) Long Term Goal(s): Knowledge of disease and therapeutic regimen to maintain health will improve  Short Term Goals: Ability to remain free from injury will improve, Ability to verbalize feelings will improve and Ability to disclose and discuss suicidal ideas  Medication Management: RN will administer medications as ordered by provider, will assess and evaluate patient's response and provide education to patient for prescribed medication. RN will report any adverse and/or side effects to prescribing provider.  Therapeutic Interventions: 1 on 1 counseling sessions, Psychoeducation, Medication  administration,  Evaluate responses to treatment, Monitor vital signs and CBGs as ordered, Perform/monitor CIWA, COWS, AIMS and Fall Risk screenings as ordered, Perform wound care treatments as ordered.  Evaluation of Outcomes: Progressing   LCSW Treatment Plan for Primary Diagnosis: MDD (major depressive disorder), recurrent episode, severe ([place]) Long Term Goal(s): Safe transition to appropriate next level of care at discharge, Engage patient in therapeutic group addressing interpersonal concerns.  Short Term Goals: Engage patient in aftercare planning with referrals and resources, Increase ability to appropriately verbalize feelings and Increase emotional regulation  Therapeutic Interventions: Assess for all discharge needs, 1 to 1 time with Social worker, Explore available resources and support systems, Assess for adequacy in community support network, Educate family and significant other(s) on suicide prevention, Complete Psychosocial Assessment, Interpersonal group therapy.  Evaluation of Outcomes: Progressing   Progress in Treatment: Attending groups: No. Participating in groups: No. Taking medication as prescribed: Yes. Toleration medication: Yes. Family/Significant other contact made: No, will contact:  pt's husband with pt consent Patient understands diagnosis: Yes. Discussing patient identified problems/goals with staff: Yes. Medical problems stabilized or resolved: Yes. Denies suicidal/homicidal ideation: Yes. Issues/concerns per patient self-inventory: No. Other: n/a   New problem(s) identified: No, Describe:  n/a  New Short Term/Long Term Goal(s): medication management for mood stabilization; elimination of SI thoughts; development of comprehensive mental wellness/sobriety plan.   Patient Goals:  "My goal is to not feel so sad and hopeless. But I want to go home."   Discharge Plan or Barriers: CSW assessing--pt plans to return home with her husband at discharge. Pt has follow-up with  PCP in [place], [place]. Pt declined additional referrals for psychiatry/therapy. [place] pamphlet, Mobile Crisis information, and AA/NA information provided to patient for additional community support and resources.   Reason for Continuation of Hospitalization: Anxiety Depression Medication stabilization Suicidal ideation  Estimated Length of Stay: Monday, 01/02/18  Attendees: Patient: Diana Hernandez  12/30/2017 8:43 AM  Physician: Dr. Parke Poisson MD; Dr. Nancy Fetter MD 12/30/2017 8:43 AM  Nursing: Clarise Cruz RN; Chrys Racer RN 12/30/2017 8:43 AM  RN Care Manager:x 12/30/2017 8:43 AM  Social Worker: Janice Norrie LCSW 12/30/2017 8:43 AM  Recreational Therapist: x 12/30/2017 8:43 AM  Other: Lindell Spar NP; Waylan Boga NP 12/30/2017 8:43 AM  Other:  12/30/2017 8:43 AM  Other: 12/30/2017 8:43 AM    Scribe for Treatment Team: Avelina Laine, LCSW 12/30/2017 8:43 AM

## 2017-12-31 NOTE — Progress Notes (Addendum)
[facility] MD Progress Note  12/31/2017 3:29 PM Diana Hernandez  MRN:  [id number] Subjective: I am doing much better.  I am having some trouble with some constipation.  Diana Hernandez is a 62 y/o F with history of MDD who was admitted voluntarily from [facility] ED where she was referred from her primary care office after she attempted suicide on 6/10 by taking 96 tablets of an OTC sleep aid. She was restarted on home medication of effexor XR and additionally started on trial of abilify to address her mood symptoms.  62 year old female, presented to ED on 6/11  via EMS following overdose on prescribed Topamax.  Admitted under commitment generated by mother.patient is alert and oriented, calm and cooperative.  She is observed attending groups this morning, and states her goal for today is to work on her suicidal thoughts.  Patient does show improved mood that is congruent with her affect.  During the time of this evaluation patient was observed resting in bed, noting that she was fatigued.  She is able to contract for safety while in the hospital. She denies HI/AH/VH. She is tolerating her medications well, and she denies side effects. She denies other physical complaints. Discussed with patient that we will plan to continue her current regimen without changes at this time, and continue to monitor her on the inpatient unit. Pt verbalized understanding. She had no further questions, comments, or concerns.  Principal Problem: MDD (major depressive disorder), recurrent episode, severe ([place]) Diagnosis:   Patient Active Problem List   Diagnosis Date Noted  . MDD (major depressive disorder), recurrent episode, severe ([place]) [F33.2] 12/28/2017   Total Time spent with patient: 30 minutes  Past Psychiatric History: see H&P  Past Medical History:  Past Medical History:  Diagnosis Date  . Cancer ([place])   . Depression   . Suicide attempt [facility])     Past Surgical History:  Procedure Laterality Date  . BREAST SURGERY      Family History: History reviewed. No pertinent family history. Family Psychiatric  History: see H&P Social History:  Social History   Substance and Sexual Activity  Alcohol Use Yes   Comment: occ     Social History   Substance and Sexual Activity  Drug Use No    Social History   Socioeconomic History  . Marital status: Divorced    Spouse name: Not on file  . Number of children: Not on file  . Years of education: Not on file  . Highest education level: Not on file  Occupational History  . Not on file  Social Needs  . Financial resource strain: Not on file  . Food insecurity:    Worry: Not on file    Inability: Not on file  . Transportation needs:    Medical: Not on file    Non-medical: Not on file  Tobacco Use  . Smoking status: Current Every Day Smoker  . Smokeless tobacco: Never Used  Substance and Sexual Activity  . Alcohol use: Yes    Comment: occ  . Drug use: No  . Sexual activity: Yes  Lifestyle  . Physical activity:    Days per week: Not on file    Minutes per session: Not on file  . Stress: Not on file  Relationships  . Social connections:    Talks on phone: Not on file    Gets together: Not on file    Attends religious service: Not on file    Active member  of club or organization: Not on file    Attends meetings of clubs or organizations: Not on file    Relationship status: Not on file  Other Topics Concern  . Not on file  Social History Narrative  . Not on file   Additional Social History:                         Sleep: Good  Appetite:  Good  Current Medications: Current Facility-Administered Medications  Medication Dose Route Frequency Provider Last Rate Last Dose  . acetaminophen (TYLENOL) tablet 650 mg  650 mg Oral Q6H PRN Laverle Hobby, PA-C      . alum & mag hydroxide-simeth (MAALOX/MYLANTA) 200-200-20 MG/5ML suspension 30 mL  30 mL Oral Q6H PRN Patriciaann Clan E, PA-C      . ARIPiprazole (ABILIFY) tablet 10 mg  10 mg  Oral Daily Lindell Spar I, NP   10 mg at 12/31/17 0823  . bisacodyl (DULCOLAX) EC tablet 5 mg  5 mg Oral Daily PRN Laverle Hobby, PA-C   5 mg at 12/30/17 2234  . famotidine (PEPCID) tablet 20 mg  20 mg Oral BID PRN Laverle Hobby, PA-C      . hydrOXYzine (ATARAX/VISTARIL) tablet 25 mg  25 mg Oral Q6H PRN Lindell Spar I, NP      . ibuprofen (ADVIL,MOTRIN) tablet 600 mg  600 mg Oral Q6H PRN Patriciaann Clan E, PA-C      . loperamide (IMODIUM) capsule 4 mg  4 mg Oral PRN Laverle Hobby, PA-C      . nicotine polacrilex (NICORETTE) gum 2 mg  2 mg Oral PRN Cobos, Myer Peer, MD   2 mg at 12/31/17 1301  . nitrofurantoin (macrocrystal-monohydrate) (MACROBID) capsule 100 mg  100 mg Oral Q12H Rankin, Shuvon B, NP   100 mg at 12/31/17 0904  . traZODone (DESYREL) tablet 50 mg  50 mg Oral QHS,MR X 1 Simon, Spencer E, PA-C      . venlafaxine XR (EFFEXOR-XR) 24 hr capsule 150 mg  150 mg Oral Q breakfast Lindell Spar I, NP   150 mg at 12/31/17 4098    Lab Results: No results found for this or any previous visit (from the past 48 hour(s)).  Blood Alcohol level:  Lab Results  Component Value Date   ETH <10 11/91/4782    Metabolic Disorder Labs: No results found for: HGBA1C, MPG No results found for: PROLACTIN No results found for: CHOL, TRIG, HDL, CHOLHDL, VLDL, LDLCALC  Physical Findings: AIMS: Facial and Oral Movements Muscles of Facial Expression: None, normal Lips and Perioral Area: None, normal Jaw: None, normal Tongue: None, normal,Extremity Movements Upper (arms, wrists, hands, fingers): None, normal Lower (legs, knees, ankles, toes): None, normal, Trunk Movements Neck, shoulders, hips: None, normal, Overall Severity Severity of abnormal movements (highest score from questions above): None, normal Incapacitation due to abnormal movements: None, normal Patient's awareness of abnormal movements (rate only patient's report): No Awareness, Dental Status Current problems with teeth and/or  dentures?: No Does patient usually wear dentures?: No  CIWA:    COWS:     Musculoskeletal: Strength & Muscle Tone: within normal limits Gait & Station: normal Patient leans: N/A  Psychiatric Specialty Exam: Physical Exam  Nursing note and vitals reviewed.   Review of Systems  Constitutional: Negative for chills and fever.  Respiratory: Negative for cough and shortness of breath.   Cardiovascular: Negative for chest pain.  Gastrointestinal: Negative for abdominal pain,  heartburn, nausea and vomiting.  Psychiatric/Behavioral: Negative for depression, hallucinations and suicidal ideas. The patient is not nervous/anxious and does not have insomnia.     Blood pressure (!) 122/91, pulse 78, temperature 97.8 F (36.6 C), temperature source Oral, resp. rate 16, height 5\' 3"  (1.6 m), weight 49.4 kg (109 lb).Body mass index is 19.31 kg/m.  General Appearance: Casual and Fairly Groomed  Eye Contact:  Good  Speech:  Clear and Coherent and Normal Rate  Volume:  Normal  Mood:  Depressed and Irritable  Affect:  Congruent and Labile  Thought Process:  Coherent and Goal Directed  Orientation:  Full (Time, Place, and Person)  Thought Content:  Logical  Suicidal Thoughts:  Yes.  without intent/plan  Homicidal Thoughts:  No  Memory:  Immediate;   Fair Recent;   Fair Remote;   Fair  Judgement:  Poor  Insight:  Lacking  Psychomotor Activity:  Normal  Concentration:  Concentration: Fair  Recall:  [employer] of Knowledge:  Fair  Language:  Fair  Akathisia:  No  Handed:    AIMS (if indicated):     Assets:  Resilience Social Support  ADL's:  Intact  Cognition:  WNL  Sleep:  Number of Hours: 5   Treatment Plan Summary: Daily contact with patient to assess and evaluate symptoms and progress in treatment and Medication management   -Continue inpatient hospitalization  -Major depressive disorder, recurrent, severe, without psychosis -Continue Effexor XR 150mg  po  qDay -Continue abilify 10mg  po qDay  -Anxiety -Continue vistaril 25mg  po q6h prn anxiety  -Insomnia -Continue trazodone 50mg  po qhs prn insomnia  -UTI -Continue macrobid 100mg  po q12h for 9 doses  -Encourage participation in groups and therapeutic milieu  -Disposition planning will be ongoing  Nanci Pina, FNP 12/31/2017, 3:29 PM    ..Agree with NP Progress Note

## 2017-12-31 NOTE — BHH Group Notes (Signed)
[place] Group Notes:  (Nursing)  Date:  12/31/2017  Time:  1:15 PM Type of Therapy:  Nurse Education  Participation Level:  Did Not Attend  Participation Quality:  Did not attend  Affect:  Did not attend  Cognitive:  Did not attend  Insight:  None  Engagement in Group:  None  Modes of Intervention:  Did not attend  Summary of Progress/Problems:  Waymond Cera 12/31/2017, 2:38 PM

## 2017-12-31 NOTE — Progress Notes (Signed)
D: Patient alert and oriented. Affect/mood: anxious, depressed, sullen. Denies SI, HI, AVH at this time. Denies pain. Goal: Patient was not able to identify per self inventory sheet though when asked about what she intends to accomplish today patient shares that she wants to go home, though understands that she has to be here a few days longer. Patient would like for her mood to improve, and feels confident that she will be in a place where she is feeling better some day. Patient reports that she has slept "good" without the use of sleep medications, and has "good" appetite and concentration. Patient reports "normal " energy level and denies any depression, hopelessness, or anxiety, rating these "0" (0-10).   A: Scheduled medications administered to patient per MD order. Scheduled Macrobid antibiotic medication administered per MD order. Patient denies any intolerance to antibiotic or worsened signs or symptoms of UTI. Support and encouragement provided. Routine safety checks conducted every 15 minutes. Patient informed to notify staff with problems or concerns.  R: No adverse drug reactions noted. Patient contracts for safety at this time. Verbalizes understanding of scheduled medications. Patient compliant with medications and treatment plan. Patient receptive, calm, and cooperative, is soft spoken and minimal during interaction. Patient is pleasant, though remains anxious in appearance. Patient interacts well, though minimally with others on the unit. Patient remains safe at this time. Will continue to monitor.

## 2017-12-31 NOTE — BHH Group Notes (Signed)
LCSW Group Therapy Note  12/31/2017    10:00-11:00am   Type of Therapy and Topic:  Group Therapy: Anger and Coping Skills  Participation Level:  Active   Description of Group:   In this group, patients learned how to recognize the physical, cognitive, emotional, and behavioral responses they have to anger-provoking situations.  They identified how they usually or often react when angered, and learned how healthy and unhealthy coping skills work initially, but the unhealthy ones stop working.   They analyzed how their frequently-chosen coping skill is possibly beneficial and how it is possibly unhelpful.  The group discussed a variety of healthier coping skills that could help in resolving the actual issues, as well as how to go about planning for the the possibility of future similar situations.  Therapeutic Goals: 1. Patients will identify one thing that makes them angry and how they feel emotionally and physically, what their thoughts are or tend to be in those situations, and what healthy or unhealthy coping mechanism they typically use 2. Patients will identify how their coping technique works for them, as well as how it works against them. 3. Patients will explore possible new behaviors to use in future anger situations. 4. Patients will learn that anger itself is normal and cannot be eliminated, and that healthier coping skills can assist with resolving conflict rather than worsening situations.  Summary of Patient Progress:  The patient shared that she often cusses when angry.  Yesterday she states she asked for nicotine gum all day long and kept getting more frustrated when nobody helped her.  She did not participate a great deal by talking in group but did listen attentively.  Therapeutic Modalities:   Cognitive Behavioral Therapy Motivation Interviewing  Maretta Los  .

## 2017-12-31 NOTE — Progress Notes (Signed)
D . Pt pleasant on approach, no complaints voiced.  Pt was positive for evening wrap up group, observed engaged in appropriate interaction within the milieu.  Pt denies SI/HI/AVH at this time.  She denies need for sleep medication at this time.  A.  Support and encouragement offered, medication given as ordered except where stated above.  R.  Pt remains safe on the unit, will continue to monitor.

## 2018-01-01 NOTE — Plan of Care (Signed)
  Problem: Education: Goal: Emotional status will improve Outcome: Progressing Goal: Mental status will improve Outcome: Progressing   Problem: Health Behavior/Discharge Planning: Goal: Compliance with treatment plan for underlying cause of condition will improve Outcome: Progressing   

## 2018-01-01 NOTE — Progress Notes (Signed)
Pt attended goals and orientation group this morning. Pt goal for the day is to speak to the doctor and figure out a discharge plan

## 2018-01-01 NOTE — Progress Notes (Addendum)
[facility] MD Progress Note  01/01/2018 12:51 PM Diana Hernandez  MRN:  [id number] Subjective: I feel much better.  I am a lot more alert and energized.  I do not have any depression or anxiety anymore.  Diana Hernandez is a 62 y/o F with history of MDD who was admitted voluntarily from [facility] ED where she was referred from her primary care office after she attempted suicide on 6/10 by taking 96 tablets of an OTC sleep aid. She was restarted on home medication of effexor XR and additionally started on trial of abilify to address her mood symptoms.  62 year old female, presented to ED on 6/11  via EMS following overdose on prescribed Topamax.  Admitted under commitment generated by mother.patient is alert and oriented, calm and cooperative.  She is observed attending groups this morning, and states her goal for today is to work on identifying coping skills for depression and anxiety. "I am working on what I can do to prevent my depression when I returned home."  Upon observation patient presents with much improved since her admission to the unit, she is no longer disheveled and is observed with clean appearance hair intact, and dressed appropriately.  She is able to contract for safety while in the hospital. She denies HI/AH/VH. She is tolerating her medications well, and she denies side effects. She denies other physical complaints. Discussed with patient that we will plan to continue her current regimen without changes at this time, and continue to monitor her on the inpatient unit. Pt verbalized understanding. She had no further questions, comments, or concerns.  Principal Problem: MDD (major depressive disorder), recurrent episode, severe ([place]) Diagnosis:   Patient Active Problem List   Diagnosis Date Noted  . MDD (major depressive disorder), recurrent episode, severe ([place]) [F33.2] 12/28/2017   Total Time spent with patient: 30 minutes  Past Psychiatric History: see H&P  Past Medical History:  Past  Medical History:  Diagnosis Date  . Cancer ([place])   . Depression   . Suicide attempt [facility])     Past Surgical History:  Procedure Laterality Date  . BREAST SURGERY     Family History: History reviewed. No pertinent family history. Family Psychiatric  History: see H&P Social History:  Social History   Substance and Sexual Activity  Alcohol Use Yes   Comment: occ     Social History   Substance and Sexual Activity  Drug Use No    Social History   Socioeconomic History  . Marital status: Divorced    Spouse name: Not on file  . Number of children: Not on file  . Years of education: Not on file  . Highest education level: Not on file  Occupational History  . Not on file  Social Needs  . Financial resource strain: Not on file  . Food insecurity:    Worry: Not on file    Inability: Not on file  . Transportation needs:    Medical: Not on file    Non-medical: Not on file  Tobacco Use  . Smoking status: Current Every Day Smoker  . Smokeless tobacco: Never Used  Substance and Sexual Activity  . Alcohol use: Yes    Comment: occ  . Drug use: No  . Sexual activity: Yes  Lifestyle  . Physical activity:    Days per week: Not on file    Minutes per session: Not on file  . Stress: Not on file  Relationships  . Social connections:  Talks on phone: Not on file    Gets together: Not on file    Attends religious service: Not on file    Active member of club or organization: Not on file    Attends meetings of clubs or organizations: Not on file    Relationship status: Not on file  Other Topics Concern  . Not on file  Social History Narrative  . Not on file   Additional Social History:                         Sleep: Good  Appetite:  Good  Current Medications: Current Facility-Administered Medications  Medication Dose Route Frequency Provider Last Rate Last Dose  . acetaminophen (TYLENOL) tablet 650 mg  650 mg Oral Q6H PRN Laverle Hobby, PA-C      .  alum & mag hydroxide-simeth (MAALOX/MYLANTA) 200-200-20 MG/5ML suspension 30 mL  30 mL Oral Q6H PRN Patriciaann Clan E, PA-C      . ARIPiprazole (ABILIFY) tablet 10 mg  10 mg Oral Daily Lindell Spar I, NP   10 mg at 01/01/18 0803  . bisacodyl (DULCOLAX) EC tablet 5 mg  5 mg Oral Daily PRN Laverle Hobby, PA-C   5 mg at 12/31/17 1911  . famotidine (PEPCID) tablet 20 mg  20 mg Oral BID PRN Laverle Hobby, PA-C      . hydrOXYzine (ATARAX/VISTARIL) tablet 25 mg  25 mg Oral Q6H PRN Lindell Spar I, NP      . ibuprofen (ADVIL,MOTRIN) tablet 600 mg  600 mg Oral Q6H PRN Patriciaann Clan E, PA-C      . loperamide (IMODIUM) capsule 4 mg  4 mg Oral PRN Laverle Hobby, PA-C      . nicotine polacrilex (NICORETTE) gum 2 mg  2 mg Oral PRN Cobos, Myer Peer, MD   2 mg at 01/01/18 0803  . nitrofurantoin (macrocrystal-monohydrate) (MACROBID) capsule 100 mg  100 mg Oral Q12H Rankin, Shuvon B, NP   100 mg at 01/01/18 0958  . traZODone (DESYREL) tablet 50 mg  50 mg Oral QHS,MR X 1 Simon, Spencer E, PA-C      . venlafaxine XR (EFFEXOR-XR) 24 hr capsule 150 mg  150 mg Oral Q breakfast Lindell Spar I, NP   150 mg at 01/01/18 0803    Lab Results: No results found for this or any previous visit (from the past 48 hour(s)).  Blood Alcohol level:  Lab Results  Component Value Date   ETH <10 24/03/7352    Metabolic Disorder Labs: No results found for: HGBA1C, MPG No results found for: PROLACTIN No results found for: CHOL, TRIG, HDL, CHOLHDL, VLDL, LDLCALC  Physical Findings: AIMS: Facial and Oral Movements Muscles of Facial Expression: None, normal Lips and Perioral Area: None, normal Jaw: None, normal Tongue: None, normal,Extremity Movements Upper (arms, wrists, hands, fingers): None, normal Lower (legs, knees, ankles, toes): None, normal, Trunk Movements Neck, shoulders, hips: None, normal, Overall Severity Severity of abnormal movements (highest score from questions above): None, normal Incapacitation due  to abnormal movements: None, normal Patient's awareness of abnormal movements (rate only patient's report): No Awareness, Dental Status Current problems with teeth and/or dentures?: No Does patient usually wear dentures?: No  CIWA:    COWS:     Musculoskeletal: Strength & Muscle Tone: within normal limits Gait & Station: normal Patient leans: N/A  Psychiatric Specialty Exam: Physical Exam  Nursing note and vitals reviewed.   Review of Systems  Constitutional: Negative for chills and fever.  Respiratory: Negative for cough and shortness of breath.   Cardiovascular: Negative for chest pain.  Gastrointestinal: Negative for abdominal pain, heartburn, nausea and vomiting.  Psychiatric/Behavioral: Negative for depression, hallucinations and suicidal ideas. The patient is not nervous/anxious and does not have insomnia.     Blood pressure 113/82, pulse 87, temperature 98.2 F (36.8 C), temperature source Oral, resp. rate 16, height 5\' 3"  (1.6 m), weight 49.4 kg (109 lb).Body mass index is 19.31 kg/m.  General Appearance: Casual and Fairly Groomed  Eye Contact:  Good  Speech:  Clear and Coherent and Normal Rate  Volume:  Normal  Mood:  Depressed and Irritable  Affect:  Congruent and Labile  Thought Process:  Coherent and Goal Directed  Orientation:  Full (Time, Place, and Person)  Thought Content:  Logical  Suicidal Thoughts:  Yes.  without intent/plan  Homicidal Thoughts:  No  Memory:  Immediate;   Fair Recent;   Fair Remote;   Fair  Judgement:  Poor  Insight:  Lacking  Psychomotor Activity:  Normal  Concentration:  Concentration: Fair  Recall:  [employer] of Knowledge:  Fair  Language:  Fair  Akathisia:  No  Handed:    AIMS (if indicated):     Assets:  Resilience Social Support  ADL's:  Intact  Cognition:  WNL  Sleep:  Number of Hours: 6   Treatment Plan Summary: Daily contact with patient to assess and evaluate symptoms and progress in treatment and Medication  management   -Continue inpatient hospitalization  -Major depressive disorder, recurrent, severe, without psychosis -Continue Effexor XR 150mg  po qDay -Continue abilify 10mg  po qDay  -Anxiety -Continue vistaril 25mg  po q6h prn anxiety  -Insomnia -Continue trazodone 50mg  po qhs prn insomnia  -UTI -Continue macrobid 100mg  po q12h for 9 doses  -Encourage participation in groups and therapeutic milieu  -Disposition planning will be ongoing  Nanci Pina, FNP 01/01/2018, 12:51 PM    ..Agree with NP Progress Note

## 2018-01-01 NOTE — BHH Group Notes (Signed)
[facility] LCSW Group Therapy Note  01/01/2018  10:00-11:00AM  Type of Therapy and Topic:  Group Therapy:  Being Your Own Support  Participation Level:  Active   Description of Group:  Patients in this group were introduced to the concept that self-support is an essential part of recovery.  A song entitled "My Own Hero" was played and a group discussion ensued in which patients stated they could relate to the song and it inspired them to realize they have be willing to help themselves in order to succeed, because other people cannot achieve sobriety or stability for them.  We discussed adding a variety of healthy supports to address the various needs in their lives.  A song was played called "I Know Where I've Been" toward the end of group and used to conduct an inspirational wrap-up to group of remembering how far they have already come in their journey.  Therapeutic Goals: 1)  demonstrate the importance of being a part of one's own support system 2)  discuss reasons people in one's life may eventually be unable to be continually supportive  3)  identify the patient's current support system and   4)  elicit commitments to add healthy supports and to become more conscious of being self-supportive   Summary of Patient Progress:  The patient expressed that she has good support, "more than I thought," including her parents and friends.  She did not talk a great deal during group but was very attentive.  She did respond to people's comments when they were directed to her.  She said her main issue that she needs treatment and support for is her depression.  CSW gave examples of how being self-supportive as well as accepting support from others applies whether it is a physical ailment, a substance abuse problem, or a mental illness.  She expressed understanding.   Therapeutic Modalities:   Motivational Interviewing Activity  Maretta Los

## 2018-01-01 NOTE — BHH Group Notes (Signed)
[place] Group Notes:  (Nursing)  Date:  01/01/2018  Time: 1:15 PM Type of Therapy:  Nurse Education  Participation Level:  Active  Participation Quality:  Appropriate  Affect:  Appropriate  Cognitive:  Appropriate  Insight:  Appropriate  Engagement in Group:  Engaged  Modes of Intervention:  Discussion, Education and Exploration  Summary of Progress/Problems: Group reviewed and completed suicide safety plan, identified specific triggers, healthy coping skills, etc  Waymond Cera 01/01/2018, 2:30 PM

## 2018-01-01 NOTE — Progress Notes (Signed)
D. Pt friendly and appropriate upon approach- observed attending am SW group. Per pt's self inventory, pt rates her depression, hopelessness, and anxiety all 0's. Pt writes that her most important goal to work on today is "thinking positive".Pt currently denies SI/HI and AV hallucinations. A. Labs and vitals monitored. Pt compliant with medications. Pt supported emotionally and encouraged to express concerns and ask questions.   R. Pt remains safe with 15 minute checks. Will continue POC.

## 2018-01-01 NOTE — Progress Notes (Signed)
D.  Pt pleasant on approach, denies complaints at this time.  Pt states that she is sleeping without problem without sleep medication.  Pt was positive for evening AA group, observed interacting minimally but appropriately with peers on the unit.  Pt denies SI/HI/AVH at this time.  Pt is hopeful for discharge tomorrow.  A.  Support and encouragement offered, medication given as ordered except sleep medication as noted.  R.  Pt remains safe on the unit, will continue to monitor.

## 2018-01-01 NOTE — Progress Notes (Signed)
Patient did attend the evening speaker AA meeting.  

## 2018-01-02 MED ORDER — VENLAFAXINE HCL ER 150 MG PO CP24: 150.0000 mg | ORAL_CAPSULE | Freq: Every day | ORAL | 0 refills | Status: AC

## 2018-01-02 MED ORDER — FAMOTIDINE 20 MG PO TABS: 20.0000 mg | ORAL_TABLET | Freq: Two times a day (BID) | ORAL | 0 refills | Status: DC | PRN

## 2018-01-02 MED ORDER — TRAZODONE HCL 50 MG PO TABS: 50.0000 mg | ORAL_TABLET | Freq: Every evening | ORAL | 0 refills | Status: DC | PRN

## 2018-01-02 MED ORDER — HYDROXYZINE HCL 25 MG PO TABS: 25.0000 mg | ORAL_TABLET | Freq: Four times a day (QID) | ORAL | 0 refills | Status: DC | PRN

## 2018-01-02 MED ORDER — ARIPIPRAZOLE 10 MG PO TABS: 10.0000 mg | ORAL_TABLET | Freq: Every day | ORAL | 0 refills | Status: DC

## 2018-01-02 MED ORDER — NICOTINE POLACRILEX 2 MG MT GUM: 2.0000 mg | CHEWING_GUM | OROMUCOSAL | 0 refills | Status: DC | PRN

## 2018-01-02 MED ORDER — BISACODYL 5 MG PO TBEC: 5.0000 mg | DELAYED_RELEASE_TABLET | Freq: Every day | ORAL | 0 refills | Status: DC | PRN

## 2018-01-02 NOTE — Progress Notes (Signed)
Pt discharged home with her mom. Pt was ambulatory, stable and appreciative at that time. All papers and prescriptions were given and valuables returned. Verbal understanding expressed. Denies SI/HI and A/VH. Pt given opportunity to express concerns and ask questions.

## 2018-01-02 NOTE — Progress Notes (Signed)
  [facility] Adult Case Management Discharge Plan :  Will you be returning to the same living situation after discharge:  Yes,  home At discharge, do you have transportation home?: Yes,  husband/family member Do you have the ability to pay for your medications: Yes,  [employer]  Release of information consent forms completed and submitted to medical records by CSW.  Patient to Follow up at: Follow-up Information    [facility] on 01/05/2018.   Why:  Hospital follow-up on Thursday, 6/20 at 8:00AM. Thank you.  Contact information: [place] [address] [phone]         Patient declined additional referrals.   Next level of care provider has access to [facility] and Suicide Prevention discussed: Yes,  SPE completed with pt's mother. SPI pamphlet and Mobile Crisis information provided to pt.   Have you used any form of tobacco in the last 30 days? (Cigarettes, Smokeless Tobacco, Cigars, and/or Pipes): Yes  Has patient been referred to the Quitline?: Patient refused referral  Patient has been referred for addiction treatment: Pt. refused referral  Avelina Laine, LCSW 01/02/2018, 9:24 AM

## 2018-01-02 NOTE — BHH Suicide Risk Assessment (Signed)
[facility] Discharge Suicide Risk Assessment   Principal Problem: MDD (major depressive disorder), recurrent episode, severe ([place]) Discharge Diagnoses:  Patient Active Problem List   Diagnosis Date Noted  . MDD (major depressive disorder), recurrent episode, severe ([place]) [F33.2] 12/28/2017    Total Time spent with patient: 30 minutes  Musculoskeletal: Strength & Muscle Tone: within normal limits Gait & Station: normal Patient leans: N/A  Psychiatric Specialty Exam: Review of Systems  Constitutional: Negative for chills and fever.  Respiratory: Negative for cough and shortness of breath.   Cardiovascular: Negative for chest pain.  Gastrointestinal: Negative for abdominal pain, heartburn, nausea and vomiting.  Psychiatric/Behavioral: Negative for depression, hallucinations and suicidal ideas. The patient is not nervous/anxious and does not have insomnia.     Blood pressure 123/89, pulse 89, temperature (!) 97.5 F (36.4 C), temperature source Oral, resp. rate 16, height 5\' 3"  (1.6 m), weight 49.4 kg (109 lb).Body mass index is 19.31 kg/m.  General Appearance: Casual and Fairly Groomed  Engineer, water::  Good  Speech:  Clear and Coherent and Normal Rate  Volume:  Normal  Mood:  Euthymic  Affect:  Appropriate and Congruent  Thought Process:  Coherent and Goal Directed  Orientation:  Full (Time, Place, and Person)  Thought Content:  Logical  Suicidal Thoughts:  No  Homicidal Thoughts:  No  Memory:  Immediate;   Fair Recent;   Fair Remote;   Fair  Judgement:  Fair  Insight:  Fair  Psychomotor Activity:  Normal  Concentration:  Fair  Recall:  [employer] of Knowledge:Fair  Language: Fair  Akathisia:  No  Handed:    AIMS (if indicated):     Assets:  Communication Skills Resilience Social Support  Sleep:  Number of Hours: 4  Cognition: WNL  ADL's:  Intact   Mental Status Per Nursing Assessment::   On Admission:  Suicidal ideation indicated by patient, Self-harm thoughts,  Self-harm behaviors  Demographic Factors:  Caucasian and Low socioeconomic status  Loss Factors: Financial problems/change in socioeconomic status  Historical Factors: Impulsivity  Risk Reduction Factors:   Sense of responsibility to family, Living with another person, especially a relative, Positive social support, Positive therapeutic relationship and Positive coping skills or problem solving skills  Continued Clinical Symptoms:  Depression:   Impulsivity  Cognitive Features That Contribute To Risk:  None    Suicide Risk:  Minimal: No identifiable suicidal ideation.  Patients presenting with no risk factors but with morbid ruminations; may be classified as minimal risk based on the severity of the depressive symptoms  Follow-up Information    [facility] up on 01/05/2018.   Why:  Hospital follow-up on Thursday, 6/20 at 8:00AM. Thank you.  Contact information: [place] [address] [phone]        Patient declined additional referrals. Follow up.         Subjective Data:  Diana Hernandez is a 62 y/o F with history of MDD who was admitted voluntarily from [facility] ED where she was referred from her primary care office after she attempted suicide on 6/10 by taking 96 tablets of an OTC sleep aid. Pt was found by her friend's daughter and not immediately taken for medical evaluation. Pt was medically cleared and then transferred to [facility] for additional treatment and evaluation. She was restarted on home medication of effexor XR and additionally started on trial of abilify to address her mood symptoms. She reported incremental improvement of her presenting symptoms during her  admission.  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 Pt shares that she is happy with this resolution, and she does not feel so overwhelmed with the thought of continuing this recent pattern. She denies physical complaints today. She is sleeping well. Her appetite is good. She denies SI/HI/AH/VH. She is tolerating her medications well, and she is in agreement to continue her current regimen without changes. She was able to engage in safety planning including plan to return to [facility] or contact emergency services if she feels unable to maintain her own safety or the safety of others. Pt had no further questions, comments, or concerns.   Plan Of Care/Follow-up recommendations:   -Discharge to outpatient level of care  -Major depressive disorder, recurrent, severe, without psychosis -Continue Effexor XR 150mg  po qDay -Continue abilify 10mg  po qDay  -Anxiety -Continue vistaril 25mg  po q6h prn anxiety  -Insomnia -Continue trazodone 50mg  po qhs prn insomnia  -UTI -Continue macrobid 100mg  po q12h for 9 doses  Activity:  as tolerated Diet:  normal Tests:  NA Other:  see above for [place], MD 01/02/2018, 10:52 AM

## 2018-01-02 NOTE — Progress Notes (Signed)
Recreation Therapy Notes  Date: 6.17.19 Time: 0930 Location: 300 [address] Dayroom  Group Topic: Stress Management  Goal Area(s) Addresses:  Patient will verbalize importance of using healthy stress management.  Patient will identify positive emotions associated with healthy stress management.   Intervention: Stress Management  Activity :  Choice Meditation.  LRT played a meditation on choice.  Patients were to follow along as meditation was played to engage in activity.  Education:  Stress Management, Discharge Planning.   Education Outcome: Acknowledges edcuation/In group clarification offered/Needs additional education  Clinical Observations/Feedback: Pt did not attend group.    Victorino Sparrow, LRT/CTRS         Ria Comment, Kristoffer Bala A 01/02/2018 11:55 AM

## 2018-01-02 NOTE — Discharge Summary (Addendum)
Physician Discharge Summary Note  Patient:  Diana Hernandez is an 62 y.o., female MRN:  [id number] DOB:  [birth year] Patient phone:  [phone] (home)  Patient address:   [place] [address],  Total Time spent with patient: 20 minutes  Date of Admission:  12/28/2017 Date of Discharge: 01/02/2018  Reason for Admission: Per assessment note: This is an admission assessment for this 62 year old Caucasian female with hx of depression. Ad[facility] the [facility] from the [facility] with complaints of suicide attempt by overdose on an over the counter sleep aid #69 tablets. Chart review indicated that she cited her distraught over conflicts in her family as the trigger. She does have hx of previous suicide 5 years ago. During this assessment; [name] reports, "My [name] took me to the hospital yesterday. I had tried to overdose on some sleep medicines. I bought a bottle of sleeping pills containing 69 pills. I took all of them, drove myself to a packing lot. Then, it happened that everybody was looking for me. My girlfriend's daughter found me, called her mother who came & took me home. I don't remember much of anything else. The next day, My mama came by, gave me a choice to go to the hospital or get committed. I attempted suicide because I'm tired of living so I tried to end it. I have no reason to continue to live. All I do is work & sleep. I had attempted suicide 5 years ago by taking bunch of pills as well. 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 Later, her father came & got this kid because he said he was in a position to care for this young girl. He was later charged with indecent liberty with a minor against this little girl. I blamed myself because I thought I should not have let this child go with her father who I did not know was a child molester. I have been feeling suicidal for the last 2 weeks. So, on this past  Monday, I decided to do it. I was diagnosed with depression 6 years ago. I have been on Effexor. Life is boring. There is no enjoyment in life. I was seeing Dr. Scotty Court"    Principal Problem: MDD (major depressive disorder), recurrent episode, severe [facility]) Discharge Diagnoses: Patient Active Problem List   Diagnosis Date Noted  . MDD (major depressive disorder), recurrent episode, severe ([place]) [F33.2] 12/28/2017    Past Psychiatric History:   Past Medical History:  Past Medical History:  Diagnosis Date  . Cancer ([place])   . Depression   . Suicide attempt [facility])     Past Surgical History:  Procedure Laterality Date  . BREAST SURGERY     Family History: History reviewed. No pertinent family history. Family Psychiatric  History:  Social History:  Social History   Substance and Sexual Activity  Alcohol Use Yes   Comment: occ     Social History   Substance and Sexual Activity  Drug Use No    Social History   Socioeconomic History  . Marital status: Divorced    Spouse name: Not on file  . Number of children: Not on file  . Years of education: Not on file  . Highest education level: Not on file  Occupational History  . Not on file  Social Needs  . Financial resource strain: Not on file  .  Food insecurity:    Worry: Not on file    Inability: Not on file  . Transportation needs:    Medical: Not on file    Non-medical: Not on file  Tobacco Use  . Smoking status: Current Every Day Smoker  . Smokeless tobacco: Never Used  Substance and Sexual Activity  . Alcohol use: Yes    Comment: occ  . Drug use: No  . Sexual activity: Yes  Lifestyle  . Physical activity:    Days per week: Not on file    Minutes per session: Not on file  . Stress: Not on file  Relationships  . Social connections:    Talks on phone: Not on file    Gets together: Not on file    Attends religious service: Not on file    Active member of club or organization: Not on file    Attends meetings of  clubs or organizations: Not on file    Relationship status: Not on file  Other Topics Concern  . Not on file  Social History Narrative  . Not on file    Hospital Course:  Diana Hernandez was admitted for MDD (major depressive disorder), recurrent episode, severe ([place])  and crisis management.  Pt was treated discharged with the medications listed below under Medication List.  Medical problems were identified and treated as needed.  Home medications were restarted as appropriate.  Improvement was monitored by observation and [employer] [facility] 's daily report of symptom reduction.  Emotional and mental status was monitored by daily self-inventory reports completed by [employer] and clinical staff.         Diana Hernandez was evaluated by the treatment team for stability and plans for continued recovery upon discharge. Diana Hernandez [facility] 's motivation was an integral factor for scheduling further treatment. Employment, transportation, bed availability, health status, family support, and any pending legal issues were also considered during hospital stay. Pt was offered further treatment options upon discharge including but not limited to Residential, Intensive Outpatient, and Outpatient treatment.  Diana Hernandez will follow up with the services as listed below under Follow Up Information.     Upon completion of this admission the patient was both mentally and medically stable for discharge denying suicidal/homicidal ideation, auditory/visual/tactile hallucinations, delusional thoughts and paranoia.     [place] responded well to treatment with Abilify 10 mg, Effexor 150 mg and Trazodone 50 mg without adverse effects. Pt demonstrated improvement without reported or observed adverse effects to the point of stability appropriate for outpatient management. Pertinent labs include: CMP and Urinalysis for which outpatient follow-up is necessary for lab recheck as mentioned below. Reviewed CBC, CMP, BAL, and UDS; all  unremarkable aside from noted exceptions.   Physical Findings: AIMS: Facial and Oral Movements Muscles of Facial Expression: None, normal Lips and Perioral Area: None, normal Jaw: None, normal Tongue: None, normal,Extremity Movements Upper (arms, wrists, hands, fingers): None, normal Lower (legs, knees, ankles, toes): None, normal, Trunk Movements Neck, shoulders, hips: None, normal, Overall Severity Severity of abnormal movements (highest score from questions above): None, normal Incapacitation due to abnormal movements: None, normal Patient's awareness of abnormal movements (rate only patient's report): No Awareness, Dental Status Current problems with teeth and/or dentures?: No Does patient usually wear dentures?: No  CIWA:    COWS:     Musculoskeletal: Strength & Muscle Tone: within normal limits Gait & Station: normal Patient leans: N/A  Psychiatric Specialty Exam: see SRA by  MD Physical Exam  Vitals reviewed. Constitutional: She appears well-developed.  Cardiovascular: Normal rate.  Neurological: She is alert.  Psychiatric: She has a normal mood and affect. Her behavior is normal.    Review of Systems  Psychiatric/Behavioral: Negative for depression (stable) and suicidal ideas. The patient is not nervous/anxious.   All other systems reviewed and are negative.   Blood pressure 123/89, pulse 89, temperature (!) 97.5 F (36.4 C), temperature source Oral, resp. rate 16, height 5\' 3"  (1.6 m), weight 49.4 kg (109 lb).Body mass index is 19.31 kg/m.    Have you used any form of tobacco in the last 30 days? (Cigarettes, Smokeless Tobacco, Cigars, and/or Pipes): Yes  Has this patient used any form of tobacco in the last 30 days? (Cigarettes, Smokeless Tobacco, Cigars, and/or Pipes) Yes, Yes, A prescription for an FDA-approved tobacco cessation medication was offered at discharge and the patient refused  Blood Alcohol level:  Lab Results  Component Value Date   ETH <10  76/19/5093    Metabolic Disorder Labs:  No results found for: HGBA1C, MPG No results found for: PROLACTIN No results found for: CHOL, TRIG, HDL, CHOLHDL, VLDL, LDLCALC  See Psychiatric Specialty Exam and Suicide Risk Assessment completed by Attending Physician prior to discharge.  Discharge destination:  Home  Is patient on multiple antipsychotic therapies at discharge:  No   Has Patient had three or more failed trials of antipsychotic monotherapy by history:  No  Recommended Plan for Multiple Antipsychotic Therapies: NA   Allergies as of 01/02/2018      Reactions   Sulfa Antibiotics       Medication List    TAKE these medications     Indication  ARIPiprazole 10 MG tablet Commonly known as:  ABILIFY Take 1 tablet (10 mg total) by mouth daily. For mood control Start taking on:  01/03/2018  Indication:  Mood control   bisacodyl 5 MG EC tablet Commonly known as:  DULCOLAX Take 1 tablet (5 mg total) by mouth daily as needed for moderate constipation. (May buy from over the counter): For constipation  Indication:  Constipation   famotidine 20 MG tablet Commonly known as:  PEPCID Take 1 tablet (20 mg total) by mouth 2 (two) times daily as needed for heartburn or indigestion.  Indication:  Gastroesophageal Reflux Disease   hydrOXYzine 25 MG tablet Commonly known as:  ATARAX/VISTARIL Take 1 tablet (25 mg total) by mouth every 6 (six) hours as needed for anxiety (Sleep).  Indication:  Feeling Anxious, Insomnia   nicotine polacrilex 2 MG gum Commonly known as:  NICORETTE Take 1 each (2 mg total) by mouth as needed for smoking cessation. (May purchase from over the counter): For smoking cessation  Indication:  Nicotine Addiction   traZODone 50 MG tablet Commonly known as:  DESYREL Take 1 tablet (50 mg total) by mouth at bedtime as needed for sleep.  Indication:  Trouble Sleeping   venlafaxine XR 150 MG 24 hr capsule Commonly known as:  EFFEXOR-XR Take 1 capsule (150 mg  total) by mouth daily with breakfast. For depression Start taking on:  01/03/2018 What changed:    medication strength  how much to take  when to take this  additional instructions  Indication:  Major Depressive Disorder      Follow-up Information    [facility] Follow up on 01/05/2018.   Why:  Hospital follow-up on Thursday, 6/20 at 8:00AM. Thank you.  Contact information: [place] [address] [phone]  Patient declined additional referrals. Follow up.           Follow-up recommendations:  Activity:  as tolerated Diet:  heart healthy  Comments:  Take all medications as prescribed. Keep all follow-up appointments as scheduled.  Do not consume alcohol or use illegal drugs while on prescription medications. Report any adverse effects from your medications to your primary care provider promptly.  In the event of recurrent symptoms or worsening symptoms, call 911, a crisis hotline, or go to the nearest emergency department for evaluation.   Signed: Derrill Center, NP 01/02/2018, 1:41 PM   Patient seen, Suicide Assessment Completed.  Disposition Plan Reviewed

## 2018-01-03 NOTE — Progress Notes (Addendum)
CSW received FMLA/STD paperwork on patient. CSW left message for Lisl at [phone] requesting call back at earliest convenience. Dr. Nancy Fetter MD is willing to write her out of work until 6/24 but if she is requesting more time, her PCP must complete STD paperwork and he can cover her time inpatient on FMLA.   Bradlee Heitman S. Ouida Sills, MSW, LCSW Clinical Social Worker 01/03/2018 1:35 PM   CSW called pt several times/no answer. Dr. Maris Berger MD agreed to complete FMLA for her hospital stay (12/28/17-01/02/18) and noted that her outpatient provider would need to complete Short Term Disability paperwork and MD authorization to Return to Work form. FMLA completed and faxed to number provided by patient on paperwork: [phone].  Arwilda Georgia S. Ouida Sills, MSW, LCSW Clinical Social Worker 01/05/2018 3:22 PM

## 2018-04-13 ENCOUNTER — Encounter: Payer: Self-pay | Admitting: Thoracic Surgery (Cardiothoracic Vascular Surgery)

## 2018-04-13 ENCOUNTER — Encounter: Payer: Self-pay | Admitting: *Deleted

## 2018-04-13 ENCOUNTER — Other Ambulatory Visit: Payer: Self-pay | Admitting: *Deleted

## 2018-04-13 ENCOUNTER — Other Ambulatory Visit: Payer: Self-pay

## 2018-04-13 ENCOUNTER — Institutional Professional Consult (permissible substitution): Payer: Managed Care, Other (non HMO) | Admitting: Thoracic Surgery (Cardiothoracic Vascular Surgery)

## 2018-04-13 VITALS — BP 127/78 | HR 90 | Resp 16 | Ht 63.5 in | Wt 110.0 lb

## 2018-04-13 DIAGNOSIS — R590 Localized enlarged lymph nodes: Secondary | ICD-10-CM

## 2018-04-13 DIAGNOSIS — R599 Enlarged lymph nodes, unspecified: Secondary | ICD-10-CM

## 2018-04-13 DIAGNOSIS — D381 Neoplasm of uncertain behavior of trachea, bronchus and lung: Secondary | ICD-10-CM | POA: Insufficient documentation

## 2018-04-13 DIAGNOSIS — R591 Generalized enlarged lymph nodes: Secondary | ICD-10-CM

## 2018-04-13 NOTE — Progress Notes (Signed)
PCP is Practice, [facility] Referring Provider is Caryl Bis, MD  Chief Complaint  Patient presents with  . Lung Lesion    versus enlarged lymph node...new patient consultation, Lung nodule Chest CT 9/13/    HPI: Diana Hernandez is sent for consultation regarding a right middle lobe finding on CT.  Diana Hernandez is a 62 year old woman with a history of tobacco abuse (1 ppd x 45 years), depression, arthritis, and remote breast cancer.  She saw Dr. Quillian Quince for an annual visit.  Because of her smoking history he recommended low-dose CT screening.  The low-dose CT scan showed a question of a nodule in the right middle lobe versus adenopathy.  A repeat CT with contrast was done.  It showed right hilar adenopathy, but no discrete lung nodule.  She does have hot flashes and does have night sweats.  She denies any fevers or chills.  She denies change in appetite or weight loss.  She suffers from depression but says that is under good control presently.  She smokes a pack a day.  She works in [employer]. Zubrod Score: At the time of surgery this patient's most appropriate activity status/level should be described as: [x]     0    Normal activity, no symptoms []     1    Restricted in physical strenuous activity but ambulatory, able to do out light work []     2    Ambulatory and capable of self care, unable to do work activities, up and about >50 % of waking hours                              []     3    Only limited self care, in bed greater than 50% of waking hours []     4    Completely disabled, no self care, confined to bed or chair []     5    Moribund   Past Medical History:  Diagnosis Date  . Arthritis    osteoarthritis  . Cancer ([place])   . Depression    chronic  . 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 Substance abuse ([place])    tobacco  . Suicide attempt [facility])     Past Surgical History:  Procedure  Laterality Date  . BREAST SURGERY Left    mass/4 lymph nodes...Marland KitchenRADIATION  . FOREARM SURGERY Right    TENDON  . thumb and hand Left    rod for crushed bone    No family history on file.  Social History Social History   Tobacco Use  . Smoking status: Current Every Day Smoker    Packs/day: 1.00    Years: 45.00    Pack years: 45.00  . Smokeless tobacco: Never Used  Substance Use Topics  . Alcohol use: Yes    Comment: occ  . Drug use: No    Current Outpatient Medications  Medication Sig Dispense Refill  . ARIPiprazole (ABILIFY) 10 MG tablet Take 1 tablet (10 mg total) by mouth daily. For mood control 30 tablet 0  . venlafaxine XR (EFFEXOR-XR) 150 MG 24 hr capsule Take 1 capsule (150 mg total) by mouth daily with breakfast. For depression 30 capsule 0   No current facility-administered medications for this visit.     Allergies  Allergen Reactions  . Sulfa Antibiotics  Review of Systems  Constitutional: Positive for diaphoresis. Negative for activity change, chills, fever and unexpected weight change.  HENT: Negative for trouble swallowing and voice change.   Eyes: Negative for visual disturbance.  Respiratory: Negative for cough, shortness of breath and wheezing.   Cardiovascular: Negative for chest pain and leg swelling.  Gastrointestinal: Negative for abdominal distention and abdominal pain.  Genitourinary: Negative for difficulty urinating and dysuria.  Musculoskeletal: Positive for arthralgias and joint swelling.  Neurological: Negative for seizures and headaches.  Hematological: Negative for adenopathy. Does not bruise/bleed easily.  Psychiatric/Behavioral: Positive for dysphoric mood (well controlled currently).  All other systems reviewed and are negative.   BP 127/78 (BP Location: Right Arm, Patient Position: Sitting, Cuff Size: Normal)   Pulse 90   Resp 16   Ht 5' 3.5" (1.613 m)   Wt 110 lb (49.9 kg)   SpO2 95% Comment: RA  BMI 19.18 kg/m  Physical  Exam  Constitutional: She is oriented to person, place, and time. She appears well-developed and well-nourished. No distress.  HENT:  Head: Normocephalic and atraumatic.  Mouth/Throat: No oropharyngeal exudate.  Voice raspy  Eyes: Pupils are equal, round, and reactive to light. Conjunctivae and EOM are normal. No scleral icterus.  Neck: Neck supple. No thyromegaly present.  Cardiovascular: Normal rate, regular rhythm, normal heart sounds and intact distal pulses. Exam reveals no gallop and no friction rub.  No murmur heard. Pulmonary/Chest: Effort normal and breath sounds normal. No respiratory distress. She has no wheezes. She has no rales.  Abdominal: Soft. She exhibits no distension. There is no tenderness.  Musculoskeletal: She exhibits no edema or deformity.  Lymphadenopathy:    She has no cervical adenopathy.  Neurological: She is alert and oriented to person, place, and time. No cranial nerve deficit. She exhibits normal muscle tone. Coordination normal.  Skin: Skin is warm and dry.  Vitals reviewed.    Diagnostic Tests: CT Chest 03/31/2018 Impression 1.  Contrast-enhanced CT confirms presence of 2.1 x 1.5 cm soft tissue density along the right infrahilar vasculature.  Favored enlarged lymph node.  Central right middle lobe pulmonary nodule could look similar.  This warrants further evaluation with tissue sampling versus PET to direct tissue sampling. 2.  Age advanced coronary artery atherosclerosis.  Recommend assessment of coronary risk factors and consideration of medical therapy. 3.  Aortic atherosclerosis and emphysema. 4.  Probable food material within the esophagus.  This suggests dysmotility and could predispose the patient aspiration. Signed Abigail Miyamoto, MD I personally reviewed the CT images and concur with the findings noted above  Impression: Diana Hernandez is a 62 year old woman with a history of tobacco abuse who had a low-dose screening CT followed by a CT with  contrast which shows probable right infrahilar adenopathy.  The cyst in the confluence of the fissures around the middle lobe artery and most likely represents adenopathy.  It is possible that this could be a central right middle lobe mass but I think adenopathy is much more likely.  Differential diagnosis includes cancer, inflammation, and infection.  Unfortunately this is a difficult area to biopsy.  Is not accessible by mediastinoscopy.  It is potentially accessible by endobronchial ultrasound, but I think that has at best a 50-50 chance of giving a definitive diagnosis if lung cancer related.  If this were sarcoid or lymphoma EBUS almost certainly will not give a diagnosis.  The next step in the evaluation is to do a PET CT.  This will give [place] information about  the level of activity in the node and also look for any other sites that might be more approachable for biopsy.  Tobacco abuse-I strongly emphasized the importance of tobacco cessation not only from the standpoint of lung cancer, but also emphysema and atherosclerotic cardiovascular disease.  Aortic and coronary atherosclerosis-noted on CT scans.  Smoking cessation is of primary importance  Plan: PET CT Return in 2 weeks   Melrose Nakayama, MD [facility] [phone]

## 2018-04-13 NOTE — Progress Notes (Unsigned)
pet

## 2018-04-19 ENCOUNTER — Encounter: Payer: Self-pay | Admitting: Thoracic Surgery (Cardiothoracic Vascular Surgery)

## 2018-04-21 ENCOUNTER — Ambulatory Visit (HOSPITAL_COMMUNITY)
Admission: RE | Admit: 2018-04-21 | Discharge: 2018-04-21 | Disposition: A | Discharge status: Home / Self Care | Payer: Managed Care, Other (non HMO) | Source: Ambulatory Visit | Attending: Thoracic Surgery (Cardiothoracic Vascular Surgery) | Admitting: Thoracic Surgery (Cardiothoracic Vascular Surgery)

## 2018-04-21 DIAGNOSIS — R590 Localized enlarged lymph nodes: Secondary | ICD-10-CM

## 2018-04-21 DIAGNOSIS — J439 Emphysema, unspecified: Secondary | ICD-10-CM | POA: Insufficient documentation

## 2018-04-21 LAB — GLUCOSE, CAPILLARY: Glucose-Capillary: 85 mg/dL (ref 70–99)

## 2018-04-21 MED ORDER — FLUDEOXYGLUCOSE F - 18 (FDG) INJECTION
5.4900 | Freq: Once | INTRAVENOUS | Status: AC
  Administered 2018-04-21: 5.49 via INTRAVENOUS

## 2018-04-25 ENCOUNTER — Other Ambulatory Visit: Payer: Self-pay

## 2018-04-25 ENCOUNTER — Ambulatory Visit: Payer: Managed Care, Other (non HMO) | Admitting: Thoracic Surgery (Cardiothoracic Vascular Surgery)

## 2018-04-25 ENCOUNTER — Encounter: Payer: Self-pay | Admitting: Thoracic Surgery (Cardiothoracic Vascular Surgery)

## 2018-04-25 VITALS — BP 154/84 | HR 90 | Resp 16 | Ht 63.5 in | Wt 110.0 lb

## 2018-04-25 DIAGNOSIS — R590 Localized enlarged lymph nodes: Secondary | ICD-10-CM

## 2018-04-25 DIAGNOSIS — D381 Neoplasm of uncertain behavior of trachea, bronchus and lung: Secondary | ICD-10-CM

## 2018-04-25 NOTE — Progress Notes (Signed)
[address]       [place] [address]             [phone]     HPI: Mrs. Defrank returns to discuss the results of her PET/CT.  Anaiyah Anglemyer is a 62-year-old woman with a 45-pack-year history of tobacco abuse and a remote history of breast cancer.  She had a low-dose CT screening for lung cancer which showed a questionable right middle lobe nodule versus adenopathy.  A CT with contrast was done which showed right infrahilar adenopathy.  There is no discrete lung nodule.   She was sent for a PET CT to further evaluate this area and now returns to discuss the results.  Past Medical History:  Diagnosis Date  . Arthritis    osteoarthritis  . Cancer ([place])   . Depression    chronic  . 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 Substance abuse ([place])    tobacco  . Suicide attempt [facility])     Current Outpatient Medications  Medication Sig Dispense Refill  . ARIPiprazole (ABILIFY) 10 MG tablet Take 1 tablet (10 mg total) by mouth daily. For mood control 30 tablet 0  . venlafaxine XR (EFFEXOR-XR) 150 MG 24 hr capsule Take 1 capsule (150 mg total) by mouth daily with breakfast. For depression 30 capsule 0   No current facility-administered medications for this visit.     Physical Exam BP (!) 154/84 (BP Location: Right Arm, Patient Position: Sitting, Cuff Size: Normal)   Pulse 90   Resp 16   Ht 5' 3.5" (1.613 m)   Wt 110 lb (49.9 kg)   SpO2 96% Comment: ON RA  BMI 19.59 kg/m  62 year old woman in no acute distress Alert and oriented x3 with no focal deficits  Diagnostic Tests: NUCLEAR MEDICINE PET SKULL BASE TO THIGH  TECHNIQUE: 5.5 mCi F-18 FDG was injected intravenously. Full-ring PET imaging was performed from the skull base to thigh after the radiotracer. CT data was obtained and used for attenuation correction and anatomic localization.  Fasting blood glucose: 85  mg/dl  COMPARISON:  CT 913 19  FINDINGS: Mediastinal blood pool activity: SUV max  NECK:  Incidental CT findings: none  CHEST: RIGHT infrahilar density measuring 15 mm (image 72/4) may be slightly smaller than 18 mm re-measured on comparison. Hilar lesion has low metabolic activity (SUV max of 2.7) similar to vascular activity.  No suspicious or hypermetabolic pulmonary nodules.  No central hypermetabolic or enlarged mediastinal lymph nodes.  No supraclavicular or axillary enlarged or hypermetabolic lymph nodes.  Incidental CT findings: Centrilobular emphysema the upper lobes.  ABDOMEN/PELVIS: No abnormal hypermetabolic activity within the liver, pancreas, adrenal glands, or spleen. No hypermetabolic lymph nodes in the abdomen or pelvis.  Large benign cysts in the anterior LEFT hepatic lobe.  Incidental CT findings: IUD in expected location in the uterus.  SKELETON: No focal hypermetabolic activity to suggest skeletal metastasis.  Incidental CT findings: none  IMPRESSION: 1. Low metabolic activity of RIGHT infrahilar nodule favors benign adenopathy. 2. No evidence of primary malignancy. 3.  Emphysema (ICD10-J43.9). 4. IUD noted in uterus.   Electronically Signed   By: Suzy Bouchard M.D.   On: 04/21/2018 16:52  I personally reviewed the PET/CT images and concur with the findings noted above  Impression: Mrs. Osier is a 62 year old woman who has a history of tobacco abuse.  She had a low-dose screening CT which showed a questionable lung mass versus adenopathy in the right hilum.  A CT with contrast showed that this was probably infrahilar adenopathy.  A PET/CT was done to further evaluate.  There is a question that the nodule might be smaller.  There was very low level activity consistent with surrounding blood pool.  It is difficult to determine if the size is truly different given the difference in quality of the CT component of the scans,  but it is clear that the area is not larger.  Given the low level of metabolic activity I think there is a strong possibility that this is benign.  I do not see any compelling reason to do a biopsy at this point given the low yield with EBUS.  I did explain to Mr. Mrs. Greenwell that we can just assume this is benign and write it off.  She will need follow-up.  I recommended that we repeat a CT in 2 months which would be 3 months from her original scan.  There still is a possibility this may ultimately require biopsy.  Plan: Return in 2 months with CT chest  Melrose Nakayama, MD [facility] [phone]

## 2018-05-26 ENCOUNTER — Other Ambulatory Visit: Payer: Self-pay | Admitting: Thoracic Surgery (Cardiothoracic Vascular Surgery)

## 2018-05-26 DIAGNOSIS — R918 Other nonspecific abnormal finding of lung field: Secondary | ICD-10-CM

## 2018-06-27 ENCOUNTER — Ambulatory Visit: Payer: Managed Care, Other (non HMO) | Admitting: Thoracic Surgery (Cardiothoracic Vascular Surgery)

## 2018-06-27 ENCOUNTER — Other Ambulatory Visit: Payer: Self-pay

## 2018-06-29 ENCOUNTER — Telehealth: Payer: Self-pay

## 2018-06-29 NOTE — Telephone Encounter (Signed)
-----   Message from Melrose Nakayama, MD sent at 06/28/2018  3:21 PM EST ----- OK we definitely need her back once we can get it  [facility] ----- Message ----- From: Donnella Sham, RN Sent: 06/27/2018  10:22 AM EST To: Melrose Nakayama, MD  Looks like she was just shy of the 3 month insurance mark for approval.  I will make sure she is rescheduled with at [place].  Thanks,  Caryl Pina ----- Message ----- From: Melrose Nakayama, MD Sent: 06/27/2018  10:15 AM EST To: Donnella Sham, RN  She needs a CT at some point what was the reason for the denial/  [facility] ----- Message ----- From: Donnella Sham, RN Sent: 06/27/2018   9:36 AM EST To: Melrose Nakayama, MD  Insurance did not approve her CT scan.  Do you still want her to come in today for her appointment at 11:30 ? Or re-schedule her with a CT?  Thanks,  Caryl Pina

## 2018-08-14 ENCOUNTER — Telehealth: Payer: Self-pay

## 2018-08-14 ENCOUNTER — Other Ambulatory Visit: Payer: Self-pay

## 2018-08-14 NOTE — Telephone Encounter (Signed)
-----   Message from Melrose Nakayama, MD sent at 08/14/2018  2:33 PM EST ----- We will have to do a P to P  [facility] ----- Message ----- From: Donnella Sham, RN Sent: 08/14/2018  11:40 AM EST To: Melrose Nakayama, MD  Insurance is once again denying her CT scan.  Last time I spoke with you she was 3 months shy of the insurance mark to have another one, so we re-scheduled it.  But it was still denied.  If you still want her to have one before she is seen, it may require a peer-to-peer, just FYI.  Thanks  Caryl Pina

## 2018-08-15 ENCOUNTER — Ambulatory Visit: Payer: Managed Care, Other (non HMO) | Admitting: Thoracic Surgery (Cardiothoracic Vascular Surgery)

## 2018-09-01 ENCOUNTER — Ambulatory Visit
Admission: RE | Admit: 2018-09-01 | Discharge: 2018-09-01 | Disposition: A | Discharge status: Home / Self Care | Payer: Managed Care, Other (non HMO) | Source: Ambulatory Visit | Attending: Thoracic Surgery (Cardiothoracic Vascular Surgery) | Admitting: Thoracic Surgery (Cardiothoracic Vascular Surgery)

## 2018-09-01 DIAGNOSIS — R918 Other nonspecific abnormal finding of lung field: Secondary | ICD-10-CM

## 2018-09-01 MED ORDER — IOPAMIDOL (ISOVUE-300) INJECTION 61%
100.0000 mL | Freq: Once | INTRAVENOUS | Status: AC | PRN
  Administered 2018-09-01: 100 mL via INTRAVENOUS

## 2018-09-05 ENCOUNTER — Ambulatory Visit: Payer: Managed Care, Other (non HMO) | Admitting: Thoracic Surgery (Cardiothoracic Vascular Surgery)

## 2018-09-05 ENCOUNTER — Encounter: Payer: Self-pay | Admitting: Thoracic Surgery (Cardiothoracic Vascular Surgery)

## 2018-09-05 VITALS — BP 108/74 | HR 88 | Resp 20 | Ht 63.5 in | Wt 112.0 lb

## 2018-09-05 DIAGNOSIS — D381 Neoplasm of uncertain behavior of trachea, bronchus and lung: Secondary | ICD-10-CM | POA: Diagnosis not present

## 2018-09-05 NOTE — Progress Notes (Signed)
[address]       [place] [address]             [phone]      HPI: Diana Hernandez returns for follow-up regarding right infrahilar adenopathy   Diana Hernandez is a 63 year old woman with a history of tobacco abuse, arthritis, and depression.  She smoked about a pack of cigarettes daily for 45 years.  She started the low-dose CT screening program back in the fall.  CT showed a question of a right middle lobe nodule versus adenopathy.  A repeat CT with contrast showed right infrahilar adenopathy.  On PET CT there was no significant activity.  We discussed options of attempting endobronchial ultrasound versus radiographic follow-up and she opted for radiographic follow-up.  In the interim since her last visit, she has been feeling well.  She has not had any pulmonary issues.  She started Chantix about 2 weeks ago.  She is down to smoking 2 or 3 cigarettes a day.  Past Medical History:  Diagnosis Date  . Arthritis    osteoarthritis  . Cancer ([place])   . Depression    chronic  . 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 Substance abuse ([place])    tobacco  . Suicide attempt [facility])     Current Outpatient Medications  Medication Sig Dispense Refill  . CHANTIX STARTING MONTH PAK 0.5 MG X 11 & 1 MG X 42 tablet Take by mouth daily.    Marland Kitchen venlafaxine XR (EFFEXOR-XR) 150 MG 24 hr capsule Take 1 capsule (150 mg total) by mouth daily with breakfast. For depression 30 capsule 0   No current facility-administered medications for this visit.     Physical Exam BP 108/74   Pulse 88   Resp 20   Ht 5' 3.5" (1.613 m)   Wt 112 lb (50.8 kg)   SpO2 95% Comment: RA  BMI 19.69 kg/m \ 63 year old woman in no acute distress Alert and oriented x3 with no focal deficits Lungs diminished but equal breath sounds bilaterally, no wheezing No cervical or subclavicular adenopathy Cardiac regular rate and rhythm  normal S1 and S2  Diagnostic Tests: CT CHEST WITH CONTRAST  TECHNIQUE: Multidetector CT imaging of the chest was performed during intravenous contrast administration.  CONTRAST:  178mL ISOVUE-300 IOPAMIDOL (ISOVUE-300) INJECTION 61%  COMPARISON:  04/21/2018 PET-CT.  03/31/2018 chest CT.  FINDINGS: Cardiovascular: Normal heart size. No significant pericardial effusion/thickening. Left anterior descending coronary atherosclerosis. Atherosclerotic nonaneurysmal thoracic aorta. Normal caliber pulmonary arteries. No central pulmonary emboli.  Mediastinum/Nodes: No discrete thyroid nodules. Unremarkable esophagus. No axillary adenopathy. Left axillary surgical clips.  Lungs/Pleura: No pneumothorax. No pleural effusion. Moderate to severe centrilobular and paraseptal emphysema with mild diffuse bronchial wall thickening. No acute consolidative airspace disease. Right infrahilar 2.2 x 1.6 cm nodule (series 2/image 83), previously 2.2 x 1.6 cm on 03/31/2018 chest CT using similar measurement technique, stable. Anterior right lower lobe subpleural 0.7 cm solid pulmonary nodule (series 8/image 79) and subpleural 0.3 cm posterior left upper lobe nodule (series 8/image 38), both stable since 03/27/2018 screening chest CT. No new significant pulmonary nodules. Stable mild subpleural reticulation in the anterior left upper lobe compatible with minimal radiation fibrosis.  Upper abdomen: Hypodense 2.7 cm anterior left liver lobe lobular lesion (series 2/image 148), stable since 05/26/2016 CT abdomen study, considered benign. Stable 1.6 cm posterior right upper renal  cyst.  Musculoskeletal: No aggressive appearing focal osseous lesions. Minimal thoracic spondylosis.  IMPRESSION: 1. Right infrahilar nodule is stable since 03/31/2018 chest CT, more likely benign given absence of hypermetabolism on prior PET-CT. Bilateral subcentimeter pulmonary nodules are also stable.  Suggest that the patient resume lung cancer screening chest CT program in late fall 2020/early winter 2021. 2. One vessel coronary atherosclerosis.  Aortic Atherosclerosis (ICD10-I70.0) and Emphysema (ICD10-J43.9).   Electronically Signed   By: Ilona Sorrel M.D.   On: 09/01/2018 15:18 I personally reviewed the CT images and concur with the findings noted above  Impression: Diana Hernandez is a 63 year old woman with a history of tobacco abuse, arthritis, depression, and remote breast cancer.  She was found about 6 months ago to have an abnormality on a low-dose screening CT.  A CT with contrast showed that this was infrahilar adenopathy.  On PET CT there was minimal uptake.  She opted for radiographic follow-up.  Her scan today shows no change in the right hilar and infrahilar lymph nodes.  The infrahilar nodes are still very prominent.  Given the lack of change in 6 months I think we will continue to follow that but we still have not completely ruled out the possibility of a low-grade malignancy.  This will be more likely to be something along the lines of Castleman's or lymphoma rather than a primary lung cancer.  Tobacco abuse-congratulated her on her significant decrease in cigarette usage.  I did encourage her to set a defined quit date to completely stop altogether.  Emphysema-noted on CT.  I did point this out to her and emphasized it as another reason to quit smoking.  Plan: Return in 6 months with CT with contrast.  Melrose Nakayama, MD [facility] [phone]

## 2019-01-10 ENCOUNTER — Other Ambulatory Visit: Payer: Self-pay | Admitting: *Deleted

## 2019-01-10 DIAGNOSIS — R590 Localized enlarged lymph nodes: Secondary | ICD-10-CM

## 2019-02-21 ENCOUNTER — Other Ambulatory Visit: Payer: Self-pay | Admitting: *Deleted

## 2019-02-21 DIAGNOSIS — R590 Localized enlarged lymph nodes: Secondary | ICD-10-CM

## 2019-03-15 ENCOUNTER — Other Ambulatory Visit: Payer: Managed Care, Other (non HMO)

## 2019-03-20 ENCOUNTER — Encounter: Payer: Managed Care, Other (non HMO) | Admitting: Thoracic Surgery (Cardiothoracic Vascular Surgery)

## 2019-03-20 ENCOUNTER — Other Ambulatory Visit: Payer: Managed Care, Other (non HMO)

## 2019-04-17 ENCOUNTER — Encounter: Payer: Self-pay | Admitting: Thoracic Surgery (Cardiothoracic Vascular Surgery)

## 2019-06-14 IMAGING — CT NM PET TUM IMG INITIAL (PI) SKULL BASE T - THIGH
1 of 8 series · 1 of 25 positions shown · non-contrast
Comparison: CT 913 19

CLINICAL DATA: Initial treatment strategy for hilar adenopathy.

EXAM:
NUCLEAR MEDICINE PET SKULL BASE TO THIGH
TECHNIQUE: 5.5 mCi F-18 FDG was injected intravenously. Full-ring PET imaging
was performed from the skull base to thigh after the radiotracer. CT
data was obtained and used for attenuation correction and anatomic
localization.
Fasting blood glucose: 85 mg/dl

[Series 4: ct sk_thigh 5.0 b31f · axial · 5.0mm · 0.98mm/px · 1 of 216 slices shown]
[im 216/216  brain]
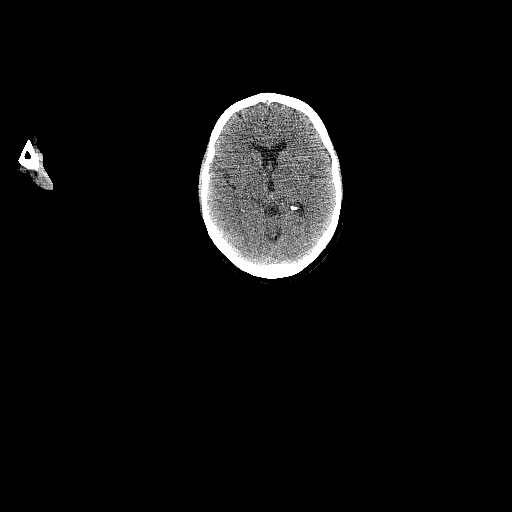

[1 of 25 positions shown; findings below may reference images not displayed]

FINDINGS: Mediastinal blood pool activity: SUV max

NECK:

Incidental CT findings: none

CHEST: RIGHT infrahilar density measuring 15 mm (image [DATE] be
slightly smaller than 18 mm re-measured on comparison. Hilar lesion
has low metabolic activity (SUV max of 2.7) similar to vascular
activity.

No suspicious or hypermetabolic pulmonary nodules.

No central hypermetabolic or enlarged mediastinal lymph nodes.

No supraclavicular or axillary enlarged or hypermetabolic lymph
nodes.

Incidental CT findings: Centrilobular emphysema the upper lobes.

ABDOMEN/PELVIS: No abnormal hypermetabolic activity within the
liver, pancreas, adrenal glands, or spleen. No hypermetabolic lymph
nodes in the abdomen or pelvis.

Large benign cysts in the anterior LEFT hepatic lobe.

Incidental CT findings: IUD in expected location in the uterus.

SKELETON: No focal hypermetabolic activity to suggest skeletal
metastasis.

Incidental CT findings: none
IMPRESSION: 1. Low metabolic activity of RIGHT infrahilar nodule favors benign
adenopathy.
2. No evidence of primary malignancy.
3.  Emphysema ([id number]).
4. IUD noted in uterus.

## 2021-07-07 ENCOUNTER — Other Ambulatory Visit: Payer: Self-pay | Admitting: Family Medicine

## 2021-07-07 ENCOUNTER — Other Ambulatory Visit (HOSPITAL_COMMUNITY): Payer: Self-pay | Admitting: Family Medicine

## 2021-07-07 DIAGNOSIS — F17210 Nicotine dependence, cigarettes, uncomplicated: Secondary | ICD-10-CM

## 2021-12-24 ENCOUNTER — Other Ambulatory Visit: Payer: Self-pay | Admitting: Family Medicine

## 2021-12-24 ENCOUNTER — Other Ambulatory Visit (HOSPITAL_COMMUNITY): Payer: Self-pay | Admitting: Family Medicine

## 2021-12-24 DIAGNOSIS — Z87891 Personal history of nicotine dependence: Secondary | ICD-10-CM

## 2022-06-25 DIAGNOSIS — Z1329 Encounter for screening for other suspected endocrine disorder: Secondary | ICD-10-CM | POA: Diagnosis not present

## 2022-06-25 DIAGNOSIS — E782 Mixed hyperlipidemia: Secondary | ICD-10-CM | POA: Diagnosis not present

## 2022-06-25 DIAGNOSIS — Z0001 Encounter for general adult medical examination with abnormal findings: Secondary | ICD-10-CM | POA: Diagnosis not present

## 2022-06-25 DIAGNOSIS — R69 Illness, unspecified: Secondary | ICD-10-CM | POA: Diagnosis not present

## 2022-06-25 DIAGNOSIS — J449 Chronic obstructive pulmonary disease, unspecified: Secondary | ICD-10-CM | POA: Diagnosis not present

## 2022-06-29 ENCOUNTER — Other Ambulatory Visit (HOSPITAL_COMMUNITY): Payer: Self-pay | Admitting: Family Medicine

## 2022-06-29 DIAGNOSIS — Z1231 Encounter for screening mammogram for malignant neoplasm of breast: Secondary | ICD-10-CM

## 2022-08-11 ENCOUNTER — Other Ambulatory Visit: Payer: Self-pay | Admitting: Family Medicine

## 2022-08-11 DIAGNOSIS — Z87891 Personal history of nicotine dependence: Secondary | ICD-10-CM

## 2022-11-08 DIAGNOSIS — Z681 Body mass index (BMI) 19 or less, adult: Secondary | ICD-10-CM | POA: Diagnosis not present

## 2022-11-08 DIAGNOSIS — F17210 Nicotine dependence, cigarettes, uncomplicated: Secondary | ICD-10-CM | POA: Diagnosis not present

## 2022-11-08 DIAGNOSIS — R030 Elevated blood-pressure reading, without diagnosis of hypertension: Secondary | ICD-10-CM | POA: Diagnosis not present

## 2022-11-08 DIAGNOSIS — N390 Urinary tract infection, site not specified: Secondary | ICD-10-CM | POA: Diagnosis not present

## 2022-12-21 DIAGNOSIS — R5383 Other fatigue: Secondary | ICD-10-CM | POA: Diagnosis not present

## 2022-12-21 DIAGNOSIS — E7849 Other hyperlipidemia: Secondary | ICD-10-CM | POA: Diagnosis not present

## 2022-12-28 DIAGNOSIS — F331 Major depressive disorder, recurrent, moderate: Secondary | ICD-10-CM | POA: Diagnosis not present

## 2022-12-28 DIAGNOSIS — E7849 Other hyperlipidemia: Secondary | ICD-10-CM | POA: Diagnosis not present

## 2022-12-28 DIAGNOSIS — M19241 Secondary osteoarthritis, right hand: Secondary | ICD-10-CM | POA: Diagnosis not present

## 2022-12-28 DIAGNOSIS — R911 Solitary pulmonary nodule: Secondary | ICD-10-CM | POA: Diagnosis not present

## 2022-12-28 DIAGNOSIS — F17210 Nicotine dependence, cigarettes, uncomplicated: Secondary | ICD-10-CM | POA: Diagnosis not present

## 2022-12-28 DIAGNOSIS — R030 Elevated blood-pressure reading, without diagnosis of hypertension: Secondary | ICD-10-CM | POA: Diagnosis not present

## 2022-12-28 DIAGNOSIS — Z681 Body mass index (BMI) 19 or less, adult: Secondary | ICD-10-CM | POA: Diagnosis not present

## 2023-04-25 DIAGNOSIS — Z681 Body mass index (BMI) 19 or less, adult: Secondary | ICD-10-CM | POA: Diagnosis not present

## 2023-04-25 DIAGNOSIS — R030 Elevated blood-pressure reading, without diagnosis of hypertension: Secondary | ICD-10-CM | POA: Diagnosis not present

## 2023-04-25 DIAGNOSIS — F17210 Nicotine dependence, cigarettes, uncomplicated: Secondary | ICD-10-CM | POA: Diagnosis not present

## 2023-04-25 DIAGNOSIS — N390 Urinary tract infection, site not specified: Secondary | ICD-10-CM | POA: Diagnosis not present

## 2023-04-25 DIAGNOSIS — R300 Dysuria: Secondary | ICD-10-CM | POA: Diagnosis not present

## 2023-05-05 DIAGNOSIS — Z23 Encounter for immunization: Secondary | ICD-10-CM | POA: Diagnosis not present

## 2023-06-27 DIAGNOSIS — E039 Hypothyroidism, unspecified: Secondary | ICD-10-CM | POA: Diagnosis not present

## 2023-06-27 DIAGNOSIS — Z1321 Encounter for screening for nutritional disorder: Secondary | ICD-10-CM | POA: Diagnosis not present

## 2023-06-27 DIAGNOSIS — E7849 Other hyperlipidemia: Secondary | ICD-10-CM | POA: Diagnosis not present

## 2023-06-27 DIAGNOSIS — Z1329 Encounter for screening for other suspected endocrine disorder: Secondary | ICD-10-CM | POA: Diagnosis not present

## 2023-06-27 DIAGNOSIS — Z0001 Encounter for general adult medical examination with abnormal findings: Secondary | ICD-10-CM | POA: Diagnosis not present

## 2023-06-27 DIAGNOSIS — F17210 Nicotine dependence, cigarettes, uncomplicated: Secondary | ICD-10-CM | POA: Diagnosis not present

## 2023-06-27 DIAGNOSIS — J449 Chronic obstructive pulmonary disease, unspecified: Secondary | ICD-10-CM | POA: Diagnosis not present

## 2023-07-04 DIAGNOSIS — F17210 Nicotine dependence, cigarettes, uncomplicated: Secondary | ICD-10-CM | POA: Diagnosis not present

## 2023-07-04 DIAGNOSIS — E7849 Other hyperlipidemia: Secondary | ICD-10-CM | POA: Diagnosis not present

## 2023-07-04 DIAGNOSIS — Z2911 Encounter for prophylactic immunotherapy for respiratory syncytial virus (RSV): Secondary | ICD-10-CM | POA: Diagnosis not present

## 2023-07-04 DIAGNOSIS — M19241 Secondary osteoarthritis, right hand: Secondary | ICD-10-CM | POA: Diagnosis not present

## 2023-07-04 DIAGNOSIS — F331 Major depressive disorder, recurrent, moderate: Secondary | ICD-10-CM | POA: Diagnosis not present

## 2023-07-04 DIAGNOSIS — Z23 Encounter for immunization: Secondary | ICD-10-CM | POA: Diagnosis not present

## 2023-07-04 DIAGNOSIS — B351 Tinea unguium: Secondary | ICD-10-CM | POA: Diagnosis not present

## 2023-07-04 DIAGNOSIS — Z0001 Encounter for general adult medical examination with abnormal findings: Secondary | ICD-10-CM | POA: Diagnosis not present

## 2023-07-04 DIAGNOSIS — Z1212 Encounter for screening for malignant neoplasm of rectum: Secondary | ICD-10-CM | POA: Diagnosis not present

## 2023-07-04 DIAGNOSIS — E782 Mixed hyperlipidemia: Secondary | ICD-10-CM | POA: Diagnosis not present

## 2023-07-04 DIAGNOSIS — Z681 Body mass index (BMI) 19 or less, adult: Secondary | ICD-10-CM | POA: Diagnosis not present

## 2023-07-04 DIAGNOSIS — R911 Solitary pulmonary nodule: Secondary | ICD-10-CM | POA: Diagnosis not present

## 2023-07-04 DIAGNOSIS — R300 Dysuria: Secondary | ICD-10-CM | POA: Diagnosis not present

## 2023-07-15 DIAGNOSIS — Z122 Encounter for screening for malignant neoplasm of respiratory organs: Secondary | ICD-10-CM | POA: Diagnosis not present

## 2023-07-15 DIAGNOSIS — Z87891 Personal history of nicotine dependence: Secondary | ICD-10-CM | POA: Diagnosis not present

## 2023-07-15 DIAGNOSIS — F17210 Nicotine dependence, cigarettes, uncomplicated: Secondary | ICD-10-CM | POA: Diagnosis not present

## 2023-08-23 DIAGNOSIS — Z1231 Encounter for screening mammogram for malignant neoplasm of breast: Secondary | ICD-10-CM | POA: Diagnosis not present

## 2023-10-28 DIAGNOSIS — F17210 Nicotine dependence, cigarettes, uncomplicated: Secondary | ICD-10-CM | POA: Diagnosis not present

## 2023-10-28 DIAGNOSIS — Z6820 Body mass index (BMI) 20.0-20.9, adult: Secondary | ICD-10-CM | POA: Diagnosis not present

## 2023-10-28 DIAGNOSIS — F331 Major depressive disorder, recurrent, moderate: Secondary | ICD-10-CM | POA: Diagnosis not present

## 2023-10-31 DIAGNOSIS — H25013 Cortical age-related cataract, bilateral: Secondary | ICD-10-CM | POA: Diagnosis not present

## 2023-10-31 DIAGNOSIS — H25043 Posterior subcapsular polar age-related cataract, bilateral: Secondary | ICD-10-CM | POA: Diagnosis not present

## 2023-10-31 DIAGNOSIS — H2511 Age-related nuclear cataract, right eye: Secondary | ICD-10-CM | POA: Diagnosis not present

## 2023-10-31 DIAGNOSIS — H2513 Age-related nuclear cataract, bilateral: Secondary | ICD-10-CM | POA: Diagnosis not present

## 2023-12-05 DIAGNOSIS — H2511 Age-related nuclear cataract, right eye: Secondary | ICD-10-CM | POA: Diagnosis not present

## 2023-12-05 DIAGNOSIS — H2512 Age-related nuclear cataract, left eye: Secondary | ICD-10-CM | POA: Diagnosis not present

## 2023-12-05 DIAGNOSIS — Z961 Presence of intraocular lens: Secondary | ICD-10-CM | POA: Diagnosis not present

## 2023-12-28 DIAGNOSIS — Z1329 Encounter for screening for other suspected endocrine disorder: Secondary | ICD-10-CM | POA: Diagnosis not present

## 2023-12-28 DIAGNOSIS — Z1321 Encounter for screening for nutritional disorder: Secondary | ICD-10-CM | POA: Diagnosis not present

## 2023-12-28 DIAGNOSIS — E7849 Other hyperlipidemia: Secondary | ICD-10-CM | POA: Diagnosis not present

## 2023-12-28 DIAGNOSIS — R5383 Other fatigue: Secondary | ICD-10-CM | POA: Diagnosis not present

## 2024-01-04 DIAGNOSIS — R911 Solitary pulmonary nodule: Secondary | ICD-10-CM | POA: Diagnosis not present

## 2024-01-04 DIAGNOSIS — F17210 Nicotine dependence, cigarettes, uncomplicated: Secondary | ICD-10-CM | POA: Diagnosis not present

## 2024-01-04 DIAGNOSIS — Z681 Body mass index (BMI) 19 or less, adult: Secondary | ICD-10-CM | POA: Diagnosis not present

## 2024-01-04 DIAGNOSIS — F331 Major depressive disorder, recurrent, moderate: Secondary | ICD-10-CM | POA: Diagnosis not present

## 2024-01-04 DIAGNOSIS — E782 Mixed hyperlipidemia: Secondary | ICD-10-CM | POA: Diagnosis not present

## 2024-01-26 DIAGNOSIS — H25012 Cortical age-related cataract, left eye: Secondary | ICD-10-CM | POA: Diagnosis not present

## 2024-01-26 DIAGNOSIS — H25042 Posterior subcapsular polar age-related cataract, left eye: Secondary | ICD-10-CM | POA: Diagnosis not present

## 2024-01-26 DIAGNOSIS — H2512 Age-related nuclear cataract, left eye: Secondary | ICD-10-CM | POA: Diagnosis not present

## 2024-02-02 DIAGNOSIS — H5203 Hypermetropia, bilateral: Secondary | ICD-10-CM | POA: Diagnosis not present

## 2024-02-29 DIAGNOSIS — L989 Disorder of the skin and subcutaneous tissue, unspecified: Secondary | ICD-10-CM | POA: Diagnosis not present

## 2024-02-29 DIAGNOSIS — D485 Neoplasm of uncertain behavior of skin: Secondary | ICD-10-CM | POA: Diagnosis not present

## 2024-07-05 ENCOUNTER — Encounter: Payer: Self-pay | Admitting: Family Medicine

## 2024-07-05 DIAGNOSIS — F17208 Nicotine dependence, unspecified, with other nicotine-induced disorders: Secondary | ICD-10-CM

## 2024-07-06 ENCOUNTER — Other Ambulatory Visit: Payer: Self-pay | Admitting: Family Medicine

## 2024-07-06 DIAGNOSIS — F17208 Nicotine dependence, unspecified, with other nicotine-induced disorders: Secondary | ICD-10-CM
